# Patient Record
Sex: Male | Born: 1950 | Race: White | Hispanic: No | Marital: Married | Smoking: Current every day smoker
Health system: Southern US, Community
[De-identification: ages and names within clinical notes are randomized; demographics above are authoritative.]

## PROBLEM LIST (undated history)

## (undated) DIAGNOSIS — J438 Other emphysema: Secondary | ICD-10-CM

## (undated) DIAGNOSIS — I10 Essential (primary) hypertension: Secondary | ICD-10-CM

## (undated) DIAGNOSIS — K759 Inflammatory liver disease, unspecified: Secondary | ICD-10-CM

## (undated) DIAGNOSIS — F419 Anxiety disorder, unspecified: Secondary | ICD-10-CM

## (undated) DIAGNOSIS — M549 Dorsalgia, unspecified: Secondary | ICD-10-CM

## (undated) DIAGNOSIS — R600 Localized edema: Secondary | ICD-10-CM

## (undated) DIAGNOSIS — M1990 Unspecified osteoarthritis, unspecified site: Secondary | ICD-10-CM

## (undated) DIAGNOSIS — J449 Chronic obstructive pulmonary disease, unspecified: Secondary | ICD-10-CM

## (undated) DIAGNOSIS — F32A Depression, unspecified: Secondary | ICD-10-CM

## (undated) DIAGNOSIS — G8929 Other chronic pain: Secondary | ICD-10-CM

## (undated) DIAGNOSIS — IMO0001 Reserved for inherently not codable concepts without codable children: Secondary | ICD-10-CM

## (undated) DIAGNOSIS — F329 Major depressive disorder, single episode, unspecified: Secondary | ICD-10-CM

## (undated) DIAGNOSIS — K219 Gastro-esophageal reflux disease without esophagitis: Secondary | ICD-10-CM

## (undated) HISTORY — DX: Dorsalgia, unspecified: M54.9

## (undated) HISTORY — DX: Localized edema: R60.0

## (undated) HISTORY — DX: Essential (primary) hypertension: I10

## (undated) HISTORY — DX: Other emphysema: J43.8

## (undated) HISTORY — PX: OTHER SURGICAL HISTORY: SHX169

## (undated) HISTORY — PX: BACK SURGERY: SHX140

## (undated) HISTORY — DX: Other chronic pain: G89.29

---

## 2002-02-06 ENCOUNTER — Ambulatory Visit (HOSPITAL_COMMUNITY): Admission: RE | Admit: 2002-02-06 | Discharge: 2002-02-06 | Payer: Self-pay | Admitting: Internal Medicine

## 2002-03-07 ENCOUNTER — Encounter (INDEPENDENT_AMBULATORY_CARE_PROVIDER_SITE_OTHER): Payer: Self-pay | Admitting: Internal Medicine

## 2002-03-07 ENCOUNTER — Ambulatory Visit (HOSPITAL_COMMUNITY): Admission: RE | Admit: 2002-03-07 | Discharge: 2002-03-07 | Payer: Self-pay | Admitting: Internal Medicine

## 2002-12-25 ENCOUNTER — Encounter: Payer: Self-pay | Admitting: Neurosurgery

## 2002-12-25 ENCOUNTER — Encounter: Admission: RE | Admit: 2002-12-25 | Discharge: 2002-12-25 | Payer: Self-pay | Admitting: Neurosurgery

## 2003-01-08 ENCOUNTER — Encounter: Payer: Self-pay | Admitting: Neurosurgery

## 2003-01-08 ENCOUNTER — Encounter: Admission: RE | Admit: 2003-01-08 | Discharge: 2003-01-08 | Payer: Self-pay | Admitting: Neurosurgery

## 2004-12-26 ENCOUNTER — Ambulatory Visit: Payer: Self-pay | Admitting: Cardiology

## 2004-12-29 ENCOUNTER — Ambulatory Visit: Payer: Self-pay | Admitting: Cardiology

## 2005-10-05 ENCOUNTER — Ambulatory Visit (HOSPITAL_COMMUNITY): Admission: RE | Admit: 2005-10-05 | Discharge: 2005-10-05 | Payer: Self-pay | Admitting: Neurosurgery

## 2005-10-10 ENCOUNTER — Ambulatory Visit (HOSPITAL_COMMUNITY): Admission: RE | Admit: 2005-10-10 | Discharge: 2005-10-11 | Payer: Self-pay | Admitting: Neurosurgery

## 2005-11-09 ENCOUNTER — Encounter (HOSPITAL_COMMUNITY): Admission: RE | Admit: 2005-11-09 | Discharge: 2005-11-22 | Payer: Self-pay | Admitting: Neurosurgery

## 2005-11-29 ENCOUNTER — Encounter (HOSPITAL_COMMUNITY): Admission: RE | Admit: 2005-11-29 | Discharge: 2005-12-29 | Payer: Self-pay | Admitting: Neurosurgery

## 2012-08-23 DIAGNOSIS — R0602 Shortness of breath: Secondary | ICD-10-CM

## 2015-12-06 DIAGNOSIS — Z79891 Long term (current) use of opiate analgesic: Secondary | ICD-10-CM | POA: Diagnosis not present

## 2015-12-06 DIAGNOSIS — M5030 Other cervical disc degeneration, unspecified cervical region: Secondary | ICD-10-CM | POA: Diagnosis not present

## 2015-12-06 DIAGNOSIS — M47816 Spondylosis without myelopathy or radiculopathy, lumbar region: Secondary | ICD-10-CM | POA: Diagnosis not present

## 2015-12-06 DIAGNOSIS — M5136 Other intervertebral disc degeneration, lumbar region: Secondary | ICD-10-CM | POA: Diagnosis not present

## 2016-01-10 DIAGNOSIS — J0100 Acute maxillary sinusitis, unspecified: Secondary | ICD-10-CM | POA: Diagnosis not present

## 2016-01-10 DIAGNOSIS — J209 Acute bronchitis, unspecified: Secondary | ICD-10-CM | POA: Diagnosis not present

## 2016-01-26 DIAGNOSIS — Z720 Tobacco use: Secondary | ICD-10-CM | POA: Diagnosis not present

## 2016-01-26 DIAGNOSIS — I10 Essential (primary) hypertension: Secondary | ICD-10-CM | POA: Diagnosis not present

## 2016-01-26 DIAGNOSIS — M5431 Sciatica, right side: Secondary | ICD-10-CM | POA: Diagnosis not present

## 2016-01-26 DIAGNOSIS — J438 Other emphysema: Secondary | ICD-10-CM | POA: Diagnosis not present

## 2016-01-26 DIAGNOSIS — Z1389 Encounter for screening for other disorder: Secondary | ICD-10-CM | POA: Diagnosis not present

## 2016-01-26 DIAGNOSIS — M545 Low back pain: Secondary | ICD-10-CM | POA: Diagnosis not present

## 2016-03-03 DIAGNOSIS — F1123 Opioid dependence with withdrawal: Secondary | ICD-10-CM | POA: Diagnosis not present

## 2016-03-03 DIAGNOSIS — R7301 Impaired fasting glucose: Secondary | ICD-10-CM | POA: Diagnosis not present

## 2016-03-03 DIAGNOSIS — Z720 Tobacco use: Secondary | ICD-10-CM | POA: Diagnosis not present

## 2016-03-03 DIAGNOSIS — K210 Gastro-esophageal reflux disease with esophagitis: Secondary | ICD-10-CM | POA: Diagnosis not present

## 2016-03-03 DIAGNOSIS — F330 Major depressive disorder, recurrent, mild: Secondary | ICD-10-CM | POA: Diagnosis not present

## 2016-03-03 DIAGNOSIS — I10 Essential (primary) hypertension: Secondary | ICD-10-CM | POA: Diagnosis not present

## 2016-03-08 DIAGNOSIS — I10 Essential (primary) hypertension: Secondary | ICD-10-CM | POA: Diagnosis not present

## 2016-03-08 DIAGNOSIS — M5431 Sciatica, right side: Secondary | ICD-10-CM | POA: Diagnosis not present

## 2016-03-08 DIAGNOSIS — M545 Low back pain: Secondary | ICD-10-CM | POA: Diagnosis not present

## 2016-03-08 DIAGNOSIS — Z720 Tobacco use: Secondary | ICD-10-CM | POA: Diagnosis not present

## 2016-03-08 DIAGNOSIS — J438 Other emphysema: Secondary | ICD-10-CM | POA: Diagnosis not present

## 2016-04-03 DIAGNOSIS — R1310 Dysphagia, unspecified: Secondary | ICD-10-CM | POA: Diagnosis not present

## 2016-04-03 DIAGNOSIS — I10 Essential (primary) hypertension: Secondary | ICD-10-CM | POA: Diagnosis not present

## 2016-04-06 ENCOUNTER — Encounter (INDEPENDENT_AMBULATORY_CARE_PROVIDER_SITE_OTHER): Payer: Self-pay | Admitting: *Deleted

## 2016-04-11 ENCOUNTER — Encounter (INDEPENDENT_AMBULATORY_CARE_PROVIDER_SITE_OTHER): Payer: Self-pay | Admitting: Internal Medicine

## 2016-04-11 ENCOUNTER — Encounter (INDEPENDENT_AMBULATORY_CARE_PROVIDER_SITE_OTHER): Payer: Self-pay

## 2016-04-11 ENCOUNTER — Encounter (INDEPENDENT_AMBULATORY_CARE_PROVIDER_SITE_OTHER): Payer: Self-pay | Admitting: *Deleted

## 2016-04-11 ENCOUNTER — Ambulatory Visit (INDEPENDENT_AMBULATORY_CARE_PROVIDER_SITE_OTHER): Payer: Medicare Other | Admitting: Internal Medicine

## 2016-04-11 DIAGNOSIS — R1319 Other dysphagia: Secondary | ICD-10-CM | POA: Insufficient documentation

## 2016-04-11 DIAGNOSIS — R1310 Dysphagia, unspecified: Secondary | ICD-10-CM | POA: Diagnosis not present

## 2016-04-11 DIAGNOSIS — F1920 Other psychoactive substance dependence, uncomplicated: Secondary | ICD-10-CM | POA: Insufficient documentation

## 2016-04-11 DIAGNOSIS — I10 Essential (primary) hypertension: Secondary | ICD-10-CM | POA: Insufficient documentation

## 2016-04-11 NOTE — Patient Instructions (Signed)
DG esophagram.   

## 2016-04-11 NOTE — Progress Notes (Signed)
   Subjective:    Patient ID: Christian Carrillo, male    DOB: [birth year], 65 y.o.   MRN: [id number]  HPI Referred by Dr. Quintin Alto for dysphagia/EGD/ED. He has a cough at night. He is on a soft diet. He says he chews his foods well. Hx of food impaction and had to have emergency EGD/ED x 3 year at [facility]. If he swallows anything large, he is afraid it will lodge. Appetite is good for the most part. No weight loss. Hx of ETOH abuse in the past.  Hx of chronic back pain. Has been off Oxycodone x 7 weeks.   06/09/2011 EGD with biopsy for H. Pylori: [place]: Bile reflux in the body and the antrum of the stomach.  06/16/2011 Colonoscopy: Dr. Yaakov Guthrie, weight loss: Scattered diverticula in sigmoid.    Review of Systems Past Medical History  Diagnosis Date  . Hypertension   . Chronic back pain     Past Surgical History  Procedure Laterality Date  . Back surgery    . Rt shoulder surgery pinning.     . Rt femur fx      traction    Allergies  Allergen Reactions  . Codeine     itching    No current outpatient prescriptions on file prior to visit.   No current facility-administered medications on file prior to visit.   Current Outpatient Prescriptions  Medication Sig Dispense Refill  . amitriptyline (ELAVIL) 25 MG tablet Take 25 mg by mouth at bedtime.    Marland Kitchen amLODipine (NORVASC) 10 MG tablet Take 10 mg by mouth daily.    . baclofen (LIORESAL) 10 MG tablet Take 10 mg by mouth 3 (three) times daily.    Marland Kitchen FLUoxetine (PROZAC) 20 MG tablet Take 20 mg by mouth daily.    Marland Kitchen lisinopril (PRINIVIL,ZESTRIL) 20 MG tablet Take 20 mg by mouth daily.    . naproxen (NAPROSYN) 500 MG tablet Take 500 mg by mouth 2 (two) times daily with a meal.    . pantoprazole (PROTONIX) 40 MG tablet Take 40 mg by mouth daily.     No current facility-administered medications for this visit.        Objective:   Physical ExamBlood pressure 132/62, pulse 64, temperature 98.4 F (36.9 C), height 6\' 1"  (1.854 m), weight  189 lb 12.8 oz (86.093 kg). Alert and oriented. Skin warm and dry. Oral mucosa is moist.   . Sclera anicteric, conjunctivae is pink. Thyroid not enlarged. No cervical lymphadenopathy. Lungs clear. Heart regular rate and rhythm.  Abdomen is soft. Bowel sounds are positive. No hepatomegaly. No abdominal masses felt. No tenderness.  No edema to lower extremities.          Assessment & Plan:  Dysphagia. Am going to get a DG Esophagram. Further recommendations to follow.

## 2016-04-13 ENCOUNTER — Telehealth (INDEPENDENT_AMBULATORY_CARE_PROVIDER_SITE_OTHER): Payer: Self-pay | Admitting: *Deleted

## 2016-04-13 ENCOUNTER — Ambulatory Visit (HOSPITAL_COMMUNITY)
Admission: RE | Admit: 2016-04-13 | Discharge: 2016-04-13 | Disposition: A | Discharge status: Home / Self Care | Payer: Medicare Other | Source: Ambulatory Visit | Attending: Internal Medicine | Admitting: Internal Medicine

## 2016-04-13 DIAGNOSIS — K219 Gastro-esophageal reflux disease without esophagitis: Secondary | ICD-10-CM | POA: Diagnosis not present

## 2016-04-13 DIAGNOSIS — R1310 Dysphagia, unspecified: Secondary | ICD-10-CM | POA: Insufficient documentation

## 2016-04-13 NOTE — Telephone Encounter (Signed)
Patient wanted you to know he is sneezing a whole lot and real hard -- they read on Internet this could be from esophagus

## 2016-04-13 NOTE — Telephone Encounter (Signed)
noted 

## 2016-04-14 ENCOUNTER — Inpatient Hospital Stay (HOSPITAL_COMMUNITY): Admission: RE | Admit: 2016-04-14 | Payer: Medicare Other | Source: Ambulatory Visit

## 2016-04-21 ENCOUNTER — Other Ambulatory Visit (INDEPENDENT_AMBULATORY_CARE_PROVIDER_SITE_OTHER): Payer: Self-pay | Admitting: Internal Medicine

## 2016-04-21 ENCOUNTER — Telehealth (INDEPENDENT_AMBULATORY_CARE_PROVIDER_SITE_OTHER): Payer: Self-pay | Admitting: Internal Medicine

## 2016-04-21 DIAGNOSIS — R059 Cough, unspecified: Secondary | ICD-10-CM

## 2016-04-21 DIAGNOSIS — R1310 Dysphagia, unspecified: Secondary | ICD-10-CM

## 2016-04-21 DIAGNOSIS — R05 Cough: Secondary | ICD-10-CM

## 2016-04-21 NOTE — Telephone Encounter (Signed)
Ann, EGD 

## 2016-04-25 DIAGNOSIS — R05 Cough: Secondary | ICD-10-CM | POA: Diagnosis not present

## 2016-04-25 DIAGNOSIS — I10 Essential (primary) hypertension: Secondary | ICD-10-CM | POA: Diagnosis not present

## 2016-04-25 DIAGNOSIS — J9801 Acute bronchospasm: Secondary | ICD-10-CM | POA: Diagnosis not present

## 2016-04-25 DIAGNOSIS — J309 Allergic rhinitis, unspecified: Secondary | ICD-10-CM | POA: Diagnosis not present

## 2016-04-25 NOTE — Telephone Encounter (Signed)
EGD sch'd 05/05/16, patient' wife was given detailed instructions

## 2016-05-02 ENCOUNTER — Ambulatory Visit (INDEPENDENT_AMBULATORY_CARE_PROVIDER_SITE_OTHER): Payer: Self-pay | Admitting: Internal Medicine

## 2016-05-03 ENCOUNTER — Encounter (HOSPITAL_COMMUNITY)
Admission: RE | Disposition: A | Discharge status: Home / Self Care | Payer: Self-pay | Source: Ambulatory Visit | Attending: Internal Medicine

## 2016-05-03 ENCOUNTER — Encounter (HOSPITAL_COMMUNITY): Payer: Self-pay | Admitting: *Deleted

## 2016-05-03 ENCOUNTER — Ambulatory Visit (HOSPITAL_COMMUNITY)
Admission: RE | Admit: 2016-05-03 | Discharge: 2016-05-03 | Disposition: A | Discharge status: Home / Self Care | Payer: Medicare Other | Source: Ambulatory Visit | Attending: Internal Medicine | Admitting: Internal Medicine

## 2016-05-03 DIAGNOSIS — K449 Diaphragmatic hernia without obstruction or gangrene: Secondary | ICD-10-CM | POA: Diagnosis not present

## 2016-05-03 DIAGNOSIS — K219 Gastro-esophageal reflux disease without esophagitis: Secondary | ICD-10-CM | POA: Diagnosis not present

## 2016-05-03 DIAGNOSIS — I10 Essential (primary) hypertension: Secondary | ICD-10-CM | POA: Diagnosis not present

## 2016-05-03 DIAGNOSIS — F329 Major depressive disorder, single episode, unspecified: Secondary | ICD-10-CM | POA: Insufficient documentation

## 2016-05-03 DIAGNOSIS — Z79899 Other long term (current) drug therapy: Secondary | ICD-10-CM | POA: Insufficient documentation

## 2016-05-03 DIAGNOSIS — R059 Cough, unspecified: Secondary | ICD-10-CM

## 2016-05-03 DIAGNOSIS — R1319 Other dysphagia: Secondary | ICD-10-CM | POA: Insufficient documentation

## 2016-05-03 DIAGNOSIS — Z7952 Long term (current) use of systemic steroids: Secondary | ICD-10-CM | POA: Diagnosis not present

## 2016-05-03 DIAGNOSIS — G8929 Other chronic pain: Secondary | ICD-10-CM | POA: Diagnosis not present

## 2016-05-03 DIAGNOSIS — K222 Esophageal obstruction: Secondary | ICD-10-CM | POA: Insufficient documentation

## 2016-05-03 DIAGNOSIS — J449 Chronic obstructive pulmonary disease, unspecified: Secondary | ICD-10-CM | POA: Diagnosis not present

## 2016-05-03 DIAGNOSIS — F419 Anxiety disorder, unspecified: Secondary | ICD-10-CM | POA: Diagnosis not present

## 2016-05-03 DIAGNOSIS — M1990 Unspecified osteoarthritis, unspecified site: Secondary | ICD-10-CM | POA: Insufficient documentation

## 2016-05-03 DIAGNOSIS — Z791 Long term (current) use of non-steroidal anti-inflammatories (NSAID): Secondary | ICD-10-CM | POA: Diagnosis not present

## 2016-05-03 DIAGNOSIS — R05 Cough: Secondary | ICD-10-CM

## 2016-05-03 DIAGNOSIS — Z87891 Personal history of nicotine dependence: Secondary | ICD-10-CM | POA: Insufficient documentation

## 2016-05-03 DIAGNOSIS — R1310 Dysphagia, unspecified: Secondary | ICD-10-CM

## 2016-05-03 HISTORY — DX: Chronic obstructive pulmonary disease, unspecified: J44.9

## 2016-05-03 HISTORY — DX: Inflammatory liver disease, unspecified: K75.9

## 2016-05-03 HISTORY — DX: Anxiety disorder, unspecified: F41.9

## 2016-05-03 HISTORY — DX: Major depressive disorder, single episode, unspecified: F32.9

## 2016-05-03 HISTORY — DX: Gastro-esophageal reflux disease without esophagitis: K21.9

## 2016-05-03 HISTORY — DX: Reserved for inherently not codable concepts without codable children: IMO0001

## 2016-05-03 HISTORY — PX: ESOPHAGOGASTRODUODENOSCOPY: SHX5428

## 2016-05-03 HISTORY — DX: Depression, unspecified: F32.A

## 2016-05-03 HISTORY — DX: Unspecified osteoarthritis, unspecified site: M19.90

## 2016-05-03 SURGERY — EGD (ESOPHAGOGASTRODUODENOSCOPY)
Anesthesia: Moderate Sedation

## 2016-05-03 MED ORDER — STERILE WATER FOR IRRIGATION IR SOLN
Status: DC | PRN
  Administered 2016-05-03: 2.5 mL

## 2016-05-03 MED ORDER — MEPERIDINE HCL 50 MG/ML IJ SOLN
INTRAMUSCULAR | Status: AC
  Filled 2016-05-03: qty 1

## 2016-05-03 MED ORDER — PROMETHAZINE HCL 25 MG/ML IJ SOLN
INTRAMUSCULAR | Status: AC
  Filled 2016-05-03: qty 1

## 2016-05-03 MED ORDER — MIDAZOLAM HCL 5 MG/5ML IJ SOLN
INTRAMUSCULAR | Status: DC | PRN
  Administered 2016-05-03: 2 mg via INTRAVENOUS
  Administered 2016-05-03: 3 mg via INTRAVENOUS

## 2016-05-03 MED ORDER — MEPERIDINE HCL 50 MG/ML IJ SOLN
INTRAMUSCULAR | Status: DC | PRN
  Administered 2016-05-03 (×2): 25 mg via INTRAVENOUS

## 2016-05-03 MED ORDER — MIDAZOLAM HCL 5 MG/5ML IJ SOLN
INTRAMUSCULAR | Status: AC
  Filled 2016-05-03: qty 10

## 2016-05-03 MED ORDER — BUTAMBEN-TETRACAINE-BENZOCAINE 2-2-14 % EX AERO
INHALATION_SPRAY | CUTANEOUS | Status: DC | PRN
  Administered 2016-05-03: 2 via TOPICAL

## 2016-05-03 MED ORDER — PROMETHAZINE HCL 25 MG/ML IJ SOLN
INTRAMUSCULAR | Status: DC | PRN
  Administered 2016-05-03: 25 mg via INTRAVENOUS

## 2016-05-03 MED ORDER — SODIUM CHLORIDE 0.9 % IV SOLN
INTRAVENOUS | Status: DC
  Administered 2016-05-03: 1000 mL via INTRAVENOUS

## 2016-05-03 MED ORDER — SODIUM CHLORIDE 0.9% FLUSH
INTRAVENOUS | Status: AC
  Filled 2016-05-03: qty 10

## 2016-05-03 NOTE — H&P (Signed)
Christian Carrillo is an 65 y.o. male.   Chief Complaint: Patient's here for EGD and ED. HPI: Patient is 65 year old Caucasian male with multiple medical problems including chronic GERD who presents with 6 week history of dysphagia to solids. He is having daily difficulty. He points to suprasternal area as the site of bolus obstruction. Recent barium study did not reveal stricture suggested esophageal motility disorder. Status heartburn is well controlled with repeat. He denies weight loss or melena. He is on naproxen for back pain. He does not drink alcohol and quit cigarette smoking 8 weeks ago.  Past Medical History  Diagnosis Date  . Hypertension   . Chronic back pain   . Depression   . Anxiety   . Arthritis   . GERD (gastroesophageal reflux disease)   . Hepatitis   . COPD (chronic obstructive pulmonary disease) ([place])   . Shortness of breath dyspnea     Past Surgical History  Procedure Laterality Date  . Back surgery    . Rt shoulder surgery pinning.     . Rt femur fx      traction    History reviewed. No pertinent family history. Social History:  reports that he quit smoking about 8 weeks ago. His smoking use included Cigarettes. He has a 30 pack-year smoking history. He does not have any smokeless tobacco history on file. He reports that he does not drink alcohol or use illicit drugs.  Allergies:  Allergies  Allergen Reactions  . Codeine     itching    Medications Prior to Admission  Medication Sig Dispense Refill  . amitriptyline (ELAVIL) 25 MG tablet Take 25 mg by mouth at bedtime.    Marland Kitchen amLODipine (NORVASC) 10 MG tablet Take 10 mg by mouth daily as needed (FOR EXTREMELY HIGH BP.).     Marland Kitchen baclofen (LIORESAL) 10 MG tablet Take 10 mg by mouth 3 (three) times daily.    Marland Kitchen FLUoxetine (PROZAC) 20 MG capsule Take 1 capsule by mouth daily.  0  . lisinopril (PRINIVIL,ZESTRIL) 20 MG tablet Take 40 mg by mouth daily.     . naproxen (NAPROSYN) 500 MG tablet Take 500 mg by mouth 2  (two) times daily with a meal.    . pantoprazole (PROTONIX) 40 MG tablet Take 40 mg by mouth daily.    . predniSONE (DELTASONE) 20 MG tablet Take 20 mg by mouth daily with breakfast.      No results found for this or any previous visit (from the past 48 hour(s)). No results found.  ROS  Blood pressure 179/93, pulse 59, temperature 97.8 F (36.6 C), temperature source Oral, resp. rate 14, height 6\' 1"  (1.854 m), weight 189 lb (85.73 kg), SpO2 97 %. Physical Exam  Constitutional: He appears well-developed and well-nourished.  HENT:  Mouth/Throat: Oropharynx is clear and moist.  Eyes: Conjunctivae are normal. No scleral icterus.  Patient has prosthetic left eye and ptosis.  Neck: No thyromegaly present.  Cardiovascular: Normal rate, regular rhythm and normal heart sounds.   No murmur heard. Respiratory: Effort normal and breath sounds normal.  GI: Soft. He exhibits no distension and no mass. There is no tenderness.  Musculoskeletal: He exhibits no edema.  Lymphadenopathy:    He has no cervical adenopathy.  Neurological: He is alert.  Skin: Skin is warm and dry.     Assessment/Plan Solid food dysphagia in patient with chronic GERD. EGD and ED.  Hildred Laser, MD 05/03/2016, 7:39 AM

## 2016-05-03 NOTE — Discharge Instructions (Signed)
Resume usual medications and diet. Remember to chew food thoroughly before swallowing No driving for 24 hours. Please call office with progress report in one week. Dr. Laural Golden [phone]  Esophagogastroduodenoscopy, Care After Refer to this sheet in the next few weeks. These instructions provide you with information about caring for yourself after your procedure. Your health care provider may also give you more specific instructions. Your treatment has been planned according to current medical practices, but problems sometimes occur. Call your health care provider if you have any problems or questions after your procedure. WHAT TO EXPECT AFTER THE PROCEDURE After your procedure, it is typical to feel:  Soreness in your throat.  Pain with swallowing.  Sick to your stomach (nauseous).  Bloated.  Dizzy.  Fatigued. HOME CARE INSTRUCTIONS  Do not eat or drink anything until the numbing medicine (local anesthetic) has worn off and your gag reflex has returned. You will know that the local anesthetic has worn off when you can swallow comfortably.  Do not drive or operate machinery until directed by your health care provider.  Take medicines only as directed by your health care provider. SEEK MEDICAL CARE IF:   You cannot stop coughing.  You are not urinating at all or less than usual. SEEK IMMEDIATE MEDICAL CARE IF:  You have difficulty swallowing.  You cannot eat or drink.  You have worsening throat or chest pain.  You have dizziness or lightheadedness or you faint.  You have nausea or vomiting.  You have chills.  You have a fever.  You have severe abdominal pain.  You have black, tarry, or bloody stools.   This information is not intended to replace advice given to you by your health care provider. Make sure you discuss any questions you have with your health care provider.   Document Released: 10/30/2012 Document Revised: 12/04/2014 Document Reviewed:  10/30/2012 Elsevier Interactive Patient Education 2016 Elsevier Inc.  Hiatal Hernia A hiatal hernia occurs when part of your stomach slides above the muscle that separates your abdomen from your chest (diaphragm). You can be born with a hiatal hernia (congenital), or it may develop over time. In almost all cases of hiatal hernia, only the top part of the stomach pushes through.  Many people have a hiatal hernia with no symptoms. The larger the hernia, the more likely that you will have symptoms. In some cases, a hiatal hernia allows stomach acid to flow back into the tube that carries food from your mouth to your stomach (esophagus). This may cause heartburn symptoms. Severe heartburn symptoms may mean you have developed a condition called gastroesophageal reflux disease (GERD).  CAUSES  Hiatal hernias are caused by a weakness in the opening (hiatus) where your esophagus passes through your diaphragm to attach to the upper part of your stomach. You may be born with a weakness in your hiatus, or a weakness can develop. RISK FACTORS Older age is a major risk factor for a hiatal hernia. Anything that increases pressure on your diaphragm can also increase your risk of a hiatal hernia. This includes:  Pregnancy.  Excess weight.  Frequent constipation. SIGNS AND SYMPTOMS  People with a hiatal hernia often have no symptoms. If symptoms develop, they are almost always caused by GERD. They may include:  Heartburn.  Belching.  Indigestion.  Trouble swallowing.  Coughing or wheezing.  Sore throat.  Hoarseness.  Chest pain. DIAGNOSIS  A hiatal hernia is sometimes found during an exam for another problem. Your health care provider may  suspect a hiatal hernia if you have symptoms of GERD. Tests may be done to diagnose GERD. These may include:  X-rays of your stomach or chest.  An upper gastrointestinal (GI) series. This is an X-ray exam of your GI tract involving the use of a chalky liquid  that you swallow. The liquid shows up clearly on the X-ray.  Endoscopy. This is a procedure to look into your stomach using a thin, flexible tube that has a tiny camera and light on the end of it. TREATMENT  If you have no symptoms, you may not need treatment. If you have symptoms, treatment may include:  Dietary and lifestyle changes to help reduce GERD symptoms.  Medicines. These may include:  Over-the-counter antacids.  Medicines that make your stomach empty more quickly.  Medicines that block the production of stomach acid (H2 blockers).  Stronger medicines to reduce stomach acid (proton pump inhibitors).  You may need surgery to repair the hernia if other treatments are not helping. HOME CARE INSTRUCTIONS   Take all medicines as directed by your health care provider.  Quit smoking, if you smoke.  Try to achieve and maintain a healthy body weight.  Eat frequent small meals instead of three large meals a day. This keeps your stomach from getting too full.  Eat slowly.  Do not lie down right after eating.  Do noteat 1-2 hours before bed.   Do not drink beverages with caffeine. These include cola, coffee, cocoa, and tea.  Do not drink alcohol.  Avoid foods that can make symptoms of GERD worse. These may include:  Fatty foods.  Citrus fruits.  Other foods and drinks that contain acid.  Avoid putting pressure on your belly. Anything that puts pressure on your belly increases the amount of acid that may be pushed up into your esophagus.   Avoid bending over, especially after eating.  Raise the head of your bed by putting blocks under the legs. This keeps your head and esophagus higher than your stomach.  Do not wear tight clothing around your chest or stomach.  Try not to strain when having a bowel movement, when urinating, or when lifting heavy objects. SEEK MEDICAL CARE IF:  Your symptoms are not controlled with medicines or lifestyle changes.  You are  having trouble swallowing.  You have coughing or wheezing that will not go away. SEEK IMMEDIATE MEDICAL CARE IF:  Your pain is getting worse.  Your pain spreads to your arms, neck, jaw, teeth, or back.  You have shortness of breath.  You sweat for no reason.  You feel sick to your stomach (nauseous) or vomit.  You vomit blood.  You have bright red blood in your stools.  You have black, tarry stools.    This information is not intended to replace advice given to you by your health care provider. Make sure you discuss any questions you have with your health care provider.   Document Released: 02/03/2004 Document Revised: 12/04/2014 Document Reviewed: 10/31/2013 Elsevier Interactive Patient Education [employer].

## 2016-05-03 NOTE — Op Note (Signed)
[facility] Patient Name: Christian Carrillo Procedure Date: 05/03/2016 7:29 AM MRN: [id number] Date of Birth: March 13, 1951 Attending MD: Hildred Laser , MD CSN: [id number] Age: 65 Admit Type: Outpatient Procedure:                Upper GI endoscopy Indications:              Esophageal dysphagia, Gastro-esophageal reflux                            disease Providers:                Hildred Laser, MD, Lurline Del, RN, Isabella Stalling,                            Technician Referring MD:             Manon Hilding Medicines:                Cetacaine spray, Promethazine 25 mg IV, Meperidine                            50 mg IV, Midazolam 5 mg IV Complications:            No immediate complications. Estimated Blood Loss:     Estimated blood loss was minimal. Procedure:                Pre-Anesthesia Assessment:                           - Prior to the procedure, a History and Physical                            was performed, and patient medications and                            allergies were reviewed. The patient's tolerance of                            previous anesthesia was also reviewed. The risks                            and benefits of the procedure and the sedation                            options and risks were discussed with the patient.                            All questions were answered, and informed consent                            was obtained. Prior Anticoagulants: The patient                            last took ibuprofen 1 day prior to the procedure.  ASA Grade Assessment: III - A patient with severe                            systemic disease. After reviewing the risks and                            benefits, the patient was deemed in satisfactory                            condition to undergo the procedure.                           After obtaining informed consent, the endoscope was                            passed under direct vision.  Throughout the                            procedure, the patient's blood pressure, pulse, and                            oxygen saturations were monitored continuously. The                            [id number]) was introduced through the mouth,                            and advanced to the second part of duodenum. The                            upper GI endoscopy was accomplished without                            difficulty. The patient tolerated the procedure                            well. Scope In: 7:51:28 AM Scope Out: 7:58:06 AM Total Procedure Duration: 0 hours 6 minutes 38 seconds  Findings:      The examined esophagus was normal.      A mild Schatzki ring (acquired) was found at the gastroesophageal       junction. The scope was withdrawn. Dilation was performed with a Maloney       dilator with mild resistance at 56 Fr. The dilation site was examined       following endoscope reinsertion and showed inimal blring secondary to       disruption of ring.      A 3 cm hiatal hernia was present.      The entire examined stomach was normal.      The duodenal bulb and second portion of the duodenum were normal. Impression:               - Normal esophagus.                           - Mild Schatzki ring. Dilated.                           -  3 cm hiatal hernia.                           - Normal stomach.                           - Normal duodenal bulb and second portion of the                            duodenum.                           - No specimens collected. Moderate Sedation:      Moderate (conscious) sedation was administered by the endoscopy nurse       and supervised by the endoscopist. The following parameters were       monitored: oxygen saturation, heart rate, blood pressure, CO2       capnography and response to care. Total physician intraservice time was       12 minutes. Recommendation:           - Patient has a contact number available for                             emergencies. The signs and symptoms of potential                            delayed complications were discussed with the                            patient. Return to normal activities tomorrow.                            Written discharge instructions were provided to the                            patient.                           - Resume previous diet today.                           - Continue present medications.                           - Return to my office in 1 year. Procedure Code(s):        --- Professional ---                           [phone], Esophagogastroduodenoscopy, flexible,                            transoral; diagnostic, including collection of                            specimen(s) by brushing or washing, when performed                            (  separate procedure)                           43450, Dilation of esophagus, by unguided sound or                            bougie, single or multiple passes                           99152, Moderate sedation services provided by the                            same physician or other qualified health care                            professional performing the diagnostic or                            therapeutic service that the sedation supports,                            requiring the presence of an independent trained                            observer to assist in the monitoring of the                            patient's level of consciousness and physiological                            status; initial 15 minutes of intraservice time,                            patient age 67 years or older Diagnosis Code(s):        --- Professional ---                           K22.2, Esophageal obstruction                           K44.9, Diaphragmatic hernia without obstruction or                            gangrene                           R13.14, Dysphagia, pharyngoesophageal phase                           K21.9,  Gastro-esophageal reflux disease without                            esophagitis CPT copyright 2016 American Medical Association. All rights reserved. The codes documented in this report are preliminary and upon coder review may  be revised to meet current compliance requirements. Hildred Laser, MD Hildred Laser, MD 05/03/2016 8:14:12 AM This  report has been signed electronically. Number of Addenda: 0

## 2016-05-05 ENCOUNTER — Encounter (HOSPITAL_COMMUNITY): Payer: Self-pay | Admitting: Internal Medicine

## 2016-05-09 ENCOUNTER — Telehealth (INDEPENDENT_AMBULATORY_CARE_PROVIDER_SITE_OTHER): Payer: Self-pay | Admitting: Internal Medicine

## 2016-05-09 NOTE — Telephone Encounter (Signed)
Patient called, stated that he was supposed to report back after a week on how he was doing since his procedure.  He is still the same problem as before the procedure.  He's also checking results.  [phone]

## 2016-05-10 NOTE — Telephone Encounter (Signed)
Referral and notes faxed to [facility], they will contact patient directly with appt 

## 2016-05-10 NOTE — Telephone Encounter (Signed)
Dr.Rehman was given this message. He states that the patient had no Bx done at the time of procedure. If the patient is still having problems - I want him to have the Manometry Impedance Study @ [facility].

## 2016-05-10 NOTE — Telephone Encounter (Signed)
Dr.Rehman will be made aware. 

## 2016-05-10 NOTE — Telephone Encounter (Signed)
Patient was called and a message was left with informtion about the test.

## 2016-05-22 DIAGNOSIS — K219 Gastro-esophageal reflux disease without esophagitis: Secondary | ICD-10-CM | POA: Diagnosis not present

## 2016-05-22 DIAGNOSIS — R1310 Dysphagia, unspecified: Secondary | ICD-10-CM | POA: Diagnosis not present

## 2016-06-01 ENCOUNTER — Telehealth (INDEPENDENT_AMBULATORY_CARE_PROVIDER_SITE_OTHER): Payer: Self-pay | Admitting: Internal Medicine

## 2016-06-01 NOTE — Telephone Encounter (Signed)
Patient called and stated that he would like his results and he'd like to know what the next step is.  [phone]

## 2016-06-02 ENCOUNTER — Telehealth (INDEPENDENT_AMBULATORY_CARE_PROVIDER_SITE_OTHER): Payer: Self-pay | Admitting: Internal Medicine

## 2016-06-02 ENCOUNTER — Other Ambulatory Visit (INDEPENDENT_AMBULATORY_CARE_PROVIDER_SITE_OTHER): Payer: Self-pay | Admitting: Internal Medicine

## 2016-06-02 DIAGNOSIS — M545 Low back pain: Secondary | ICD-10-CM | POA: Diagnosis not present

## 2016-06-02 DIAGNOSIS — G4733 Obstructive sleep apnea (adult) (pediatric): Secondary | ICD-10-CM | POA: Diagnosis not present

## 2016-06-02 DIAGNOSIS — R05 Cough: Secondary | ICD-10-CM | POA: Diagnosis not present

## 2016-06-02 DIAGNOSIS — I10 Essential (primary) hypertension: Secondary | ICD-10-CM | POA: Diagnosis not present

## 2016-06-02 MED ORDER — DILTIAZEM HCL 60 MG PO TABS: 60.0000 mg | ORAL_TABLET | Freq: Three times a day (TID) | ORAL | Status: DC

## 2016-06-02 NOTE — Telephone Encounter (Signed)
Results of manometry discussed with patient and his wife. Start patient on diltiazem 60 mg by mouth 30 minutes before each meal.. Patient's blood pressure has been running high and Dr. Quintin Alto changes medications today. Patient and his wife informed to call if he develops post dural symptoms. Prescription sent to his pharmacy. Patient will need office visit in 2 months.

## 2016-06-02 NOTE — Telephone Encounter (Signed)
Dr.Rehman has been made aware. He states that he is going to review the results and he will call the patient with that result and recommendations.

## 2016-06-03 DIAGNOSIS — Z7689 Persons encountering health services in other specified circumstances: Secondary | ICD-10-CM | POA: Diagnosis not present

## 2016-06-03 DIAGNOSIS — I10 Essential (primary) hypertension: Secondary | ICD-10-CM | POA: Diagnosis not present

## 2016-06-05 ENCOUNTER — Encounter (INDEPENDENT_AMBULATORY_CARE_PROVIDER_SITE_OTHER): Payer: Self-pay

## 2016-06-05 ENCOUNTER — Encounter (INDEPENDENT_AMBULATORY_CARE_PROVIDER_SITE_OTHER): Payer: Self-pay | Admitting: *Deleted

## 2016-06-21 DIAGNOSIS — G4733 Obstructive sleep apnea (adult) (pediatric): Secondary | ICD-10-CM | POA: Diagnosis not present

## 2016-07-11 DIAGNOSIS — G4733 Obstructive sleep apnea (adult) (pediatric): Secondary | ICD-10-CM | POA: Diagnosis not present

## 2016-07-18 ENCOUNTER — Encounter (INDEPENDENT_AMBULATORY_CARE_PROVIDER_SITE_OTHER): Payer: Self-pay | Admitting: *Deleted

## 2016-07-18 ENCOUNTER — Ambulatory Visit (INDEPENDENT_AMBULATORY_CARE_PROVIDER_SITE_OTHER): Payer: Medicare Other | Admitting: Internal Medicine

## 2016-07-18 ENCOUNTER — Encounter (INDEPENDENT_AMBULATORY_CARE_PROVIDER_SITE_OTHER): Payer: Self-pay | Admitting: Internal Medicine

## 2016-07-18 VITALS — BP 158/80 | HR 64 | Temp 98.1°F | Ht 73.0 in | Wt 201.4 lb

## 2016-07-18 DIAGNOSIS — R1310 Dysphagia, unspecified: Secondary | ICD-10-CM | POA: Diagnosis not present

## 2016-07-18 DIAGNOSIS — K219 Gastro-esophageal reflux disease without esophagitis: Secondary | ICD-10-CM

## 2016-07-18 DIAGNOSIS — B1920 Unspecified viral hepatitis C without hepatic coma: Secondary | ICD-10-CM | POA: Diagnosis not present

## 2016-07-18 LAB — CBC WITH DIFFERENTIAL/PLATELET
Basophils Absolute: 0 cells/uL (ref 0–200)
Basophils Relative: 0 %
Eosinophils Absolute: 380 cells/uL (ref 15–500)
Eosinophils Relative: 4 %
HCT: 44.9 % (ref 38.5–50.0)
Hemoglobin: 15.6 g/dL (ref 13.2–17.1)
Lymphocytes Relative: 35 %
Lymphs Abs: 3325 cells/uL (ref 850–3900)
MCH: 32.4 pg (ref 27.0–33.0)
MCHC: 34.7 g/dL (ref 32.0–36.0)
MCV: 93.2 fL (ref 80.0–100.0)
MPV: 10.4 fL (ref 7.5–12.5)
Monocytes Absolute: 950 cells/uL (ref 200–950)
Monocytes Relative: 10 %
Neutro Abs: 4845 cells/uL (ref 1500–7800)
Neutrophils Relative %: 51 %
Platelets: 249 10*3/uL (ref 140–400)
RBC: 4.82 MIL/uL (ref 4.20–5.80)
RDW: 13.5 % (ref 11.0–15.0)
WBC: 9.5 10*3/uL (ref 3.8–10.8)

## 2016-07-18 MED ORDER — PANTOPRAZOLE SODIUM 40 MG PO TBEC: 40.0000 mg | DELAYED_RELEASE_TABLET | Freq: Two times a day (BID) | ORAL | 3 refills | Status: DC

## 2016-07-18 NOTE — Progress Notes (Signed)
Subjective:    Patient ID: Christian Carrillo, male    DOB: 1951-03-13, 65 y.o.   MRN: [id number]  HPI Here today after undergoing an EGD in June of 2017.  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 He says he is doing better. He is gaining weight. He says he still "hacks and gags" but it is better.  He has gained 11 pounds. Appetite is much better. His swallowing is better.  He says his esophagus is much better. He says he has break thru reflux.  Would like his Protonix increased to BID. He says is opoid free since April.  He tells me he has Hepatitis C and would like tx. He has never been treated in the past.   05/03/2016 EGD/ED: dysphagia: GERD:  Impression:               - Normal esophagus.                           - Mild Schatzki ring. Dilated.                           - 3 cm hiatal hernia.                           - Normal stomach.                           - Normal duodenal bulb and second portion of the                            duodenum.                           - No specimens collected.  05/22/2016 Esophageal Manometry: dysphagia. Nutcrackers esophagus.   05/14/2016 DG Esopahgram: IMPRESSION: 1. Esophageal dysmotility, likely presbyesophagus. 2. Otherwise, normal esophagram.  Review of Systems Past Medical History:  Diagnosis Date  . Anxiety   . Arthritis   . Chronic back pain   . COPD (chronic obstructive pulmonary disease) ([place])   . Depression   . GERD (gastroesophageal reflux disease)   . Hepatitis   . Hypertension   . Shortness of breath dyspnea     Past Surgical History:  Procedure Laterality Date  . BACK SURGERY    . ESOPHAGOGASTRODUODENOSCOPY N/A 05/03/2016   Procedure: ESOPHAGOGASTRODUODENOSCOPY (EGD);  Surgeon: Rogene Houston, MD;  [facility];  Service: Endoscopy;  Laterality: N/A;  1:00 - moved to 6/7 @ 7:30 - Ann notified pt  . rt femur fx     traction  . rt shoulder surgery pinning.        Allergies  Allergen Reactions  . Codeine     itching    Current Outpatient Prescriptions on File Prior to Visit  Medication Sig Dispense Refill  . amitriptyline (ELAVIL) 25 MG tablet Take 25 mg by mouth at bedtime.    Marland Kitchen amLODipine (NORVASC) 10 MG tablet Take 10 mg by mouth daily as needed (FOR EXTREMELY HIGH BP.).     Marland Kitchen baclofen (LIORESAL) 10 MG tablet Take 10 mg by mouth 3 (three) times daily.    Marland Kitchen diltiazem (CARDIZEM) 60 MG tablet Take 1 tablet (60 mg total) by mouth 3 (three)  times daily before meals. 90 tablet 5  . FLUoxetine (PROZAC) 20 MG capsule Take 1 capsule by mouth daily.  0  . naproxen (NAPROSYN) 500 MG tablet Take 500 mg by mouth 2 (two) times daily with a meal.    . pantoprazole (PROTONIX) 40 MG tablet Take 40 mg by mouth daily.     No current facility-administered medications on file prior to visit.        Objective:   Physical Exam Blood pressure (!) 158/80, pulse 64, temperature 98.1 F (36.7 C), height 6\' 1"  (1.854 m), weight 201 lb 6.4 oz (91.4 kg).  Alert and oriented. Skin warm and dry. Oral mucosa is moist.   . Sclera anicteric, conjunctivae is pink. Thyroid not enlarged. No cervical lymphadenopathy. Lungs clear. Heart regular rate and rhythm.  Abdomen is soft. Bowel sounds are positive. No hepatomegaly. No abdominal masses felt. No tenderness.  No edema to lower extremities.  .      Assessment & Plan:  Dysphagia: He says he feels 25% or more better with the Cardizem.  GERD: with breakthru. Will increase Protonix to BID for 4 months. ( I discussed with Dr. Laural Golden) Hepatitis C. Will get labs and [place] elastrogrpahy.

## 2016-07-18 NOTE — Patient Instructions (Signed)
Labs today. OV 3 months.  

## 2016-07-19 ENCOUNTER — Encounter (INDEPENDENT_AMBULATORY_CARE_PROVIDER_SITE_OTHER): Payer: Self-pay | Admitting: Internal Medicine

## 2016-07-19 DIAGNOSIS — I10 Essential (primary) hypertension: Secondary | ICD-10-CM | POA: Diagnosis not present

## 2016-07-19 DIAGNOSIS — R05 Cough: Secondary | ICD-10-CM | POA: Diagnosis not present

## 2016-07-19 DIAGNOSIS — G4733 Obstructive sleep apnea (adult) (pediatric): Secondary | ICD-10-CM | POA: Diagnosis not present

## 2016-07-19 DIAGNOSIS — M545 Low back pain: Secondary | ICD-10-CM | POA: Diagnosis not present

## 2016-07-19 LAB — PROTIME-INR
INR: 1
PROTHROMBIN TIME: 10.3 s (ref 9.0–11.5)

## 2016-07-19 LAB — DRUG ABUSE PANEL 10-50 NO CONF, U
AMPHETAMINES (1000 ng/mL SCRN): NEGATIVE
BARBITURATES: NEGATIVE
BENZODIAZEPINES: NEGATIVE
COCAINE METABOLITES: NEGATIVE
MARIJUANA MET (50 ng/mL SCRN): NEGATIVE
METHADONE: NEGATIVE
METHAQUALONE: NEGATIVE
OPIATES: NEGATIVE
PHENCYCLIDINE: NEGATIVE
PROPOXYPHENE: NEGATIVE

## 2016-07-19 LAB — HEPATITIS PANEL, ACUTE
HCV Ab: REACTIVE — AB
HEP B C IGM: NONREACTIVE
Hep A IgM: NONREACTIVE
Hepatitis B Surface Ag: NEGATIVE

## 2016-07-19 LAB — HEPATIC FUNCTION PANEL
ALBUMIN: 4.3 g/dL (ref 3.6–5.1)
ALK PHOS: 100 U/L (ref 40–115)
ALT: 22 U/L (ref 9–46)
AST: 18 U/L (ref 10–35)
Bilirubin, Direct: 0.1 mg/dL (ref <=0.2)
Indirect Bilirubin: 0.5 mg/dL (ref 0.2–1.2)
Total Bilirubin: 0.6 mg/dL (ref 0.2–1.2)
Total Protein: 7.4 g/dL (ref 6.1–8.1)

## 2016-07-19 LAB — AFP TUMOR MARKER: AFP TUMOR MARKER: 6.5 ng/mL — AB (ref <6.1)

## 2016-07-20 LAB — HEPATITIS C RNA QUANTITATIVE
HCV Quantitative Log: 6.54 {Log} — ABNORMAL HIGH (ref <1.18)
HCV Quantitative: 3454575 IU/mL — ABNORMAL HIGH (ref <15)

## 2016-07-21 LAB — HEPATITIS C GENOTYPE

## 2016-07-24 ENCOUNTER — Ambulatory Visit (HOSPITAL_COMMUNITY)
Admission: RE | Admit: 2016-07-24 | Discharge: 2016-07-24 | Disposition: A | Discharge status: Home / Self Care | Payer: Medicare Other | Source: Ambulatory Visit | Attending: Internal Medicine | Admitting: Internal Medicine

## 2016-07-24 DIAGNOSIS — K760 Fatty (change of) liver, not elsewhere classified: Secondary | ICD-10-CM | POA: Diagnosis not present

## 2016-07-24 DIAGNOSIS — N281 Cyst of kidney, acquired: Secondary | ICD-10-CM | POA: Insufficient documentation

## 2016-07-24 DIAGNOSIS — B1920 Unspecified viral hepatitis C without hepatic coma: Secondary | ICD-10-CM | POA: Diagnosis not present

## 2016-07-25 ENCOUNTER — Telehealth (INDEPENDENT_AMBULATORY_CARE_PROVIDER_SITE_OTHER): Payer: Self-pay | Admitting: Internal Medicine

## 2016-07-25 NOTE — Telephone Encounter (Signed)
Christian Carrillo, Harvoni x 12 weeks.

## 2016-07-27 NOTE — Telephone Encounter (Signed)
A PA has been completed for the [facility]. Once we hear from [place]ondo we will contact the patient.

## 2016-08-10 ENCOUNTER — Telehealth (INDEPENDENT_AMBULATORY_CARE_PROVIDER_SITE_OTHER): Payer: Self-pay | Admitting: *Deleted

## 2016-08-10 ENCOUNTER — Other Ambulatory Visit (INDEPENDENT_AMBULATORY_CARE_PROVIDER_SITE_OTHER): Payer: Self-pay | Admitting: *Deleted

## 2016-08-10 DIAGNOSIS — B182 Chronic viral hepatitis C: Secondary | ICD-10-CM

## 2016-08-10 NOTE — Telephone Encounter (Signed)
Patient presented to the office today to pick up his Harvoni. He plans to start on 08/11/2016. Labs will be 09/08/2016 , they have been ordered. Patient will need a OV week of 09/11/2016.  Patient has been on Protonix twice a day. Per Lelon Perla the patient will need to stop that and he may take the Zantax OTC 150 mg. Patient needs to take these two medications 12 hours apart.  Patient will call if he has problems or concerns.

## 2016-08-21 ENCOUNTER — Encounter (INDEPENDENT_AMBULATORY_CARE_PROVIDER_SITE_OTHER): Payer: Self-pay | Admitting: *Deleted

## 2016-08-21 ENCOUNTER — Other Ambulatory Visit (INDEPENDENT_AMBULATORY_CARE_PROVIDER_SITE_OTHER): Payer: Self-pay | Admitting: *Deleted

## 2016-08-21 DIAGNOSIS — B182 Chronic viral hepatitis C: Secondary | ICD-10-CM

## 2016-08-28 ENCOUNTER — Telehealth (INDEPENDENT_AMBULATORY_CARE_PROVIDER_SITE_OTHER): Payer: Self-pay | Admitting: Internal Medicine

## 2016-08-28 NOTE — Telephone Encounter (Signed)
Patient will be called and made aware that the Mail Order Pharmacy will contact him , then [place] to arrange a date for his medication to be delivered.

## 2016-08-28 NOTE — Telephone Encounter (Signed)
Patient called, stated that he has just 9 pills left of Harvoni and needs a refill.  He would like for Tammy to call him when this has been done.  [phone]

## 2016-08-29 NOTE — Telephone Encounter (Signed)
Called [facility], the pharmacist is going to reach out to the patient and if all goes well they will sent his next medication to our office on Thursday,08/31/2016.

## 2016-09-07 ENCOUNTER — Other Ambulatory Visit (INDEPENDENT_AMBULATORY_CARE_PROVIDER_SITE_OTHER): Payer: Self-pay | Admitting: *Deleted

## 2016-09-07 DIAGNOSIS — B182 Chronic viral hepatitis C: Secondary | ICD-10-CM

## 2016-09-07 LAB — CBC
HCT: 41 % (ref 38.5–50.0)
Hemoglobin: 13.9 g/dL (ref 13.2–17.1)
MCH: 30.5 pg (ref 27.0–33.0)
MCHC: 33.9 g/dL (ref 32.0–36.0)
MCV: 89.9 fL (ref 80.0–100.0)
MPV: 10.7 fL (ref 7.5–12.5)
PLATELETS: 284 10*3/uL (ref 140–400)
RBC: 4.56 MIL/uL (ref 4.20–5.80)
RDW: 13.3 % (ref 11.0–15.0)
WBC: 12.3 10*3/uL — ABNORMAL HIGH (ref 3.8–10.8)

## 2016-09-07 LAB — COMPREHENSIVE METABOLIC PANEL
ALK PHOS: 126 U/L — AB (ref 40–115)
ALT: 16 U/L (ref 9–46)
AST: 11 U/L (ref 10–35)
Albumin: 4 g/dL (ref 3.6–5.1)
BUN: 14 mg/dL (ref 7–25)
CO2: 25 mmol/L (ref 20–31)
CREATININE: 0.96 mg/dL (ref 0.70–1.25)
Calcium: 9.2 mg/dL (ref 8.6–10.3)
Chloride: 102 mmol/L (ref 98–110)
Glucose, Bld: 82 mg/dL (ref 65–99)
Potassium: 4 mmol/L (ref 3.5–5.3)
SODIUM: 139 mmol/L (ref 135–146)
TOTAL PROTEIN: 7.2 g/dL (ref 6.1–8.1)
Total Bilirubin: 0.6 mg/dL (ref 0.2–1.2)

## 2016-09-08 LAB — HEPATITIS C RNA QUANTITATIVE
HCV QUANT: 24 [IU]/mL — AB (ref <15)
HCV Quantitative Log: 1.38 {Log} — ABNORMAL HIGH (ref <1.18)

## 2016-09-11 ENCOUNTER — Other Ambulatory Visit (INDEPENDENT_AMBULATORY_CARE_PROVIDER_SITE_OTHER): Payer: Self-pay | Admitting: *Deleted

## 2016-09-11 ENCOUNTER — Encounter (INDEPENDENT_AMBULATORY_CARE_PROVIDER_SITE_OTHER): Payer: Self-pay | Admitting: *Deleted

## 2016-09-11 ENCOUNTER — Telehealth (INDEPENDENT_AMBULATORY_CARE_PROVIDER_SITE_OTHER): Payer: Self-pay | Admitting: Internal Medicine

## 2016-09-11 DIAGNOSIS — B182 Chronic viral hepatitis C: Secondary | ICD-10-CM

## 2016-09-14 NOTE — Telephone Encounter (Signed)
error 

## 2016-09-26 ENCOUNTER — Telehealth (INDEPENDENT_AMBULATORY_CARE_PROVIDER_SITE_OTHER): Payer: Self-pay | Admitting: Internal Medicine

## 2016-09-26 LAB — HEPATITIS C RNA QUANTITATIVE: HCV QUANT: NOT DETECTED [IU]/mL (ref <15)

## 2016-09-26 NOTE — Telephone Encounter (Signed)
Patient called, stated that he had labs done yesterday.  Wants to know if the results are back and if he can get his Harvoni refilled.  [phone]

## 2016-09-26 NOTE — Telephone Encounter (Signed)
This has been sent to [facility]. Patient is to call  The mail order pharmacy , and let them know that it is time for a refill on this medication. They will call our office to make a date and time for the delivery here, and we will let the patient know.

## 2016-10-03 DIAGNOSIS — M545 Low back pain: Secondary | ICD-10-CM | POA: Diagnosis not present

## 2016-10-03 DIAGNOSIS — M5431 Sciatica, right side: Secondary | ICD-10-CM | POA: Diagnosis not present

## 2016-10-10 DIAGNOSIS — H524 Presbyopia: Secondary | ICD-10-CM | POA: Diagnosis not present

## 2016-10-18 ENCOUNTER — Encounter (INDEPENDENT_AMBULATORY_CARE_PROVIDER_SITE_OTHER): Payer: Self-pay

## 2016-10-18 ENCOUNTER — Ambulatory Visit (INDEPENDENT_AMBULATORY_CARE_PROVIDER_SITE_OTHER): Payer: Medicare Other | Admitting: Internal Medicine

## 2016-10-18 ENCOUNTER — Other Ambulatory Visit (INDEPENDENT_AMBULATORY_CARE_PROVIDER_SITE_OTHER): Payer: Self-pay | Admitting: *Deleted

## 2016-10-18 ENCOUNTER — Encounter (INDEPENDENT_AMBULATORY_CARE_PROVIDER_SITE_OTHER): Payer: Self-pay | Admitting: Internal Medicine

## 2016-10-18 VITALS — BP 142/68 | HR 80 | Temp 98.6°F | Ht 73.0 in | Wt 216.6 lb

## 2016-10-18 DIAGNOSIS — B182 Chronic viral hepatitis C: Secondary | ICD-10-CM

## 2016-10-18 DIAGNOSIS — R1310 Dysphagia, unspecified: Secondary | ICD-10-CM | POA: Diagnosis not present

## 2016-10-18 DIAGNOSIS — R1319 Other dysphagia: Secondary | ICD-10-CM

## 2016-10-18 DIAGNOSIS — B1920 Unspecified viral hepatitis C without hepatic coma: Secondary | ICD-10-CM | POA: Diagnosis not present

## 2016-10-18 NOTE — Patient Instructions (Signed)
Labs in 8 weeks. OV in 8 weeks.

## 2016-10-18 NOTE — Progress Notes (Signed)
Subjective:    Patient ID: Christian Carrillo, male    DOB: 31-Aug-1951, 65 y.o.   MRN: [id number]  HPI  Here today for f/u. Hx of Hepatitis C. Genotype 1A. Started treatment 08/11/2016. Has 15 more days and he will be finished. 09/25/2016 Hep C quaint undetected. Protonix is on hold due to the Ray.  07/24/2016 [place] Elast F3-F4 Appetite is good. He has gained 15 pounds since his visit in August.  States has been on Prednisone for back pain. He has a BM daily. No melena or BRRB.  No rash.    He underwent and EGD and Esophageal manometry in June of this year.  which revealed a Nutcracker esophagus. Weight 201 in August.   Presently taking Cardizem 60mg  TID.  He says sometimes lodge when he first began to eat.  He is chewing his food good. Dentition is very poor.    CBC    Component Value Date/Time   WBC 12.3 (H) 09/07/2016 1154   RBC 4.56 09/07/2016 1154   HGB 13.9 09/07/2016 1154   HCT 41.0 09/07/2016 1154   PLT 284 09/07/2016 1154   MCV 89.9 09/07/2016 1154   MCH 30.5 09/07/2016 1154   MCHC 33.9 09/07/2016 1154   RDW 13.3 09/07/2016 1154   LYMPHSABS 3,325 07/18/2016 1530   MONOABS 950 07/18/2016 1530   EOSABS 380 07/18/2016 1530   BASOSABS 0 07/18/2016 1530   Hepatic Function Panel     Component Value Date/Time   PROT 7.2 09/07/2016 1139   ALBUMIN 4.0 09/07/2016 1139   AST 11 09/07/2016 1139   ALT 16 09/07/2016 1139   ALKPHOS 126 (H) 09/07/2016 1139   BILITOT 0.6 09/07/2016 1139   BILIDIR 0.1 07/18/2016 1530   IBILI 0.5 07/18/2016 1530      05/03/2016 EGD/ED: dysphagia: GERD:  Impression: - Normal esophagus. - Mild Schatzki ring. Dilated. - 3 cm hiatal hernia. - Normal stomach. - Normal duodenal bulb and second portion of the  duodenum. - No specimens collected.  05/22/2016 Esophageal Manometry:  dysphagia. Nutcrackers esophagus.    Review of Systems Past Medical History:  Diagnosis Date  . Anxiety   . Arthritis   . Chronic back pain   . COPD (chronic obstructive pulmonary disease) ([place])   . Depression   . GERD (gastroesophageal reflux disease)   . Hepatitis   . Hypertension   . Shortness of breath dyspnea     Past Surgical History:  Procedure Laterality Date  . BACK SURGERY    . ESOPHAGOGASTRODUODENOSCOPY N/A 05/03/2016   Procedure: ESOPHAGOGASTRODUODENOSCOPY (EGD);  Surgeon: Rogene Houston, MD;  [facility];  Service: Endoscopy;  Laterality: N/A;  1:00 - moved to 6/7 @ 7:30 - Ann notified pt  . rt femur fx     traction  . rt shoulder surgery pinning.       Allergies  Allergen Reactions  . Codeine     itching    Current Outpatient Prescriptions on File Prior to Visit  Medication Sig Dispense Refill  . amitriptyline (ELAVIL) 25 MG tablet Take 25 mg by mouth at bedtime.    Marland Kitchen amLODipine (NORVASC) 10 MG tablet Take 10 mg by mouth daily as needed (FOR EXTREMELY HIGH BP.).     Marland Kitchen baclofen (LIORESAL) 10 MG tablet Take 10 mg by mouth 3 (three) times daily.    Marland Kitchen diltiazem (CARDIZEM) 60 MG tablet Take 1 tablet (60 mg total) by mouth 3 (three) times daily before meals. 90 tablet 5  .  FLUoxetine (PROZAC) 20 MG capsule Take 1 capsule by mouth daily.  0  . hydrochlorothiazide (MICROZIDE) 12.5 MG capsule Take 12.5 mg by mouth daily.    . naproxen (NAPROSYN) 500 MG tablet Take 500 mg by mouth 2 (two) times daily with a meal.    . pantoprazole (PROTONIX) 40 MG tablet Take 40 mg by mouth daily.    . pantoprazole (PROTONIX) 40 MG tablet Take 1 tablet (40 mg total) by mouth 2 (two) times daily before a meal. 60 tablet 3   No current facility-administered medications on file prior to visit.        Objective:   Physical Exam Blood pressure (!) 142/68, pulse 80, temperature 98.6 F (37 C), height 6\' 1"  (1.854 m), weight 216 lb 9.6 oz (98.2 kg).  Alert and oriented.  Skin warm and dry. Oral mucosa is moist.   . Sclera anicteric, conjunctivae is pink. Thyroid not enlarged. No cervical lymphadenopathy. Lungs clear. Heart regular rate and rhythm.  Abdomen is soft. Bowel sounds are positive. No hepatomegaly. No abdominal masses felt. No tenderness.  No edema to lower extremities.         Assessment & Plan:   Hepatitis C. He has cleared the virus. He is doing well. OV in 8 weeks. CBC, Hepatic function, Hep C quaint in 8 weeks.  Occasionally dysphagia. Make sure u chew your foods well. Will start Protonix after tx of his Hepatitis C.

## 2016-10-25 ENCOUNTER — Telehealth (INDEPENDENT_AMBULATORY_CARE_PROVIDER_SITE_OTHER): Payer: Self-pay | Admitting: Internal Medicine

## 2016-10-25 DIAGNOSIS — B1920 Unspecified viral hepatitis C without hepatic coma: Secondary | ICD-10-CM

## 2016-10-25 DIAGNOSIS — R140 Abdominal distension (gaseous): Secondary | ICD-10-CM

## 2016-10-25 LAB — COMPREHENSIVE METABOLIC PANEL
ALT: 31 U/L (ref 9–46)
AST: 14 U/L (ref 10–35)
Albumin: 3.6 g/dL (ref 3.6–5.1)
Alkaline Phosphatase: 101 U/L (ref 40–115)
BUN: 17 mg/dL (ref 7–25)
CALCIUM: 8.7 mg/dL (ref 8.6–10.3)
CO2: 25 mmol/L (ref 20–31)
Chloride: 99 mmol/L (ref 98–110)
Creat: 1.02 mg/dL (ref 0.70–1.25)
GLUCOSE: 141 mg/dL — AB (ref 65–99)
POTASSIUM: 4 mmol/L (ref 3.5–5.3)
Sodium: 133 mmol/L — ABNORMAL LOW (ref 135–146)
Total Bilirubin: 1.2 mg/dL (ref 0.2–1.2)
Total Protein: 6.6 g/dL (ref 6.1–8.1)

## 2016-10-25 NOTE — Telephone Encounter (Signed)
[place] sch'd 10/27/16 at 930 (915), npo after midnight, patient aware

## 2016-10-25 NOTE — Telephone Encounter (Signed)
Christian Carrillo,  US abdomen ordered

## 2016-10-27 ENCOUNTER — Ambulatory Visit (HOSPITAL_COMMUNITY)
Admission: RE | Admit: 2016-10-27 | Discharge: 2016-10-27 | Disposition: A | Discharge status: Home / Self Care | Payer: Medicare Other | Source: Ambulatory Visit | Attending: Internal Medicine | Admitting: Internal Medicine

## 2016-10-27 DIAGNOSIS — N281 Cyst of kidney, acquired: Secondary | ICD-10-CM | POA: Insufficient documentation

## 2016-10-27 DIAGNOSIS — B1920 Unspecified viral hepatitis C without hepatic coma: Secondary | ICD-10-CM | POA: Insufficient documentation

## 2016-10-27 DIAGNOSIS — R140 Abdominal distension (gaseous): Secondary | ICD-10-CM | POA: Diagnosis not present

## 2016-10-31 ENCOUNTER — Ambulatory Visit (INDEPENDENT_AMBULATORY_CARE_PROVIDER_SITE_OTHER): Payer: Medicare Other | Admitting: Internal Medicine

## 2016-11-13 DIAGNOSIS — B356 Tinea cruris: Secondary | ICD-10-CM | POA: Diagnosis not present

## 2016-11-13 DIAGNOSIS — M545 Low back pain: Secondary | ICD-10-CM | POA: Diagnosis not present

## 2016-11-13 DIAGNOSIS — R05 Cough: Secondary | ICD-10-CM | POA: Diagnosis not present

## 2016-11-13 DIAGNOSIS — I10 Essential (primary) hypertension: Secondary | ICD-10-CM | POA: Diagnosis not present

## 2016-11-13 DIAGNOSIS — G4733 Obstructive sleep apnea (adult) (pediatric): Secondary | ICD-10-CM | POA: Diagnosis not present

## 2016-11-14 DIAGNOSIS — H25011 Cortical age-related cataract, right eye: Secondary | ICD-10-CM | POA: Diagnosis not present

## 2016-11-14 DIAGNOSIS — H2511 Age-related nuclear cataract, right eye: Secondary | ICD-10-CM | POA: Diagnosis not present

## 2016-11-14 DIAGNOSIS — H25811 Combined forms of age-related cataract, right eye: Secondary | ICD-10-CM | POA: Diagnosis not present

## 2016-11-22 ENCOUNTER — Other Ambulatory Visit (INDEPENDENT_AMBULATORY_CARE_PROVIDER_SITE_OTHER): Payer: Self-pay | Admitting: *Deleted

## 2016-11-22 ENCOUNTER — Encounter (INDEPENDENT_AMBULATORY_CARE_PROVIDER_SITE_OTHER): Payer: Self-pay | Admitting: *Deleted

## 2016-11-22 DIAGNOSIS — B182 Chronic viral hepatitis C: Secondary | ICD-10-CM

## 2016-12-07 DIAGNOSIS — Z961 Presence of intraocular lens: Secondary | ICD-10-CM | POA: Diagnosis not present

## 2016-12-15 LAB — CBC
HCT: 42 % (ref 38.5–50.0)
Hemoglobin: 14.2 g/dL (ref 13.2–17.1)
MCH: 30.6 pg (ref 27.0–33.0)
MCHC: 33.8 g/dL (ref 32.0–36.0)
MCV: 90.5 fL (ref 80.0–100.0)
MPV: 10.9 fL (ref 7.5–12.5)
Platelets: 258 10*3/uL (ref 140–400)
RBC: 4.64 MIL/uL (ref 4.20–5.80)
RDW: 13.9 % (ref 11.0–15.0)
WBC: 9.5 10*3/uL (ref 3.8–10.8)

## 2016-12-15 LAB — HEPATIC FUNCTION PANEL
ALBUMIN: 4 g/dL (ref 3.6–5.1)
ALK PHOS: 115 U/L (ref 40–115)
ALT: 15 U/L (ref 9–46)
AST: 12 U/L (ref 10–35)
BILIRUBIN TOTAL: 0.5 mg/dL (ref 0.2–1.2)
Bilirubin, Direct: 0.1 mg/dL (ref <=0.2)
Indirect Bilirubin: 0.4 mg/dL (ref 0.2–1.2)
Total Protein: 7.2 g/dL (ref 6.1–8.1)

## 2016-12-19 LAB — HEPATITIS C RNA QUANTITATIVE
HCV Quantitative Log: <1.18 Log IU/mL (ref <1.18)
HCV Quantitative: <15 IU/mL (ref <15)

## 2017-01-10 ENCOUNTER — Ambulatory Visit (INDEPENDENT_AMBULATORY_CARE_PROVIDER_SITE_OTHER): Payer: Medicare Other | Admitting: Internal Medicine

## 2017-01-10 ENCOUNTER — Other Ambulatory Visit (INDEPENDENT_AMBULATORY_CARE_PROVIDER_SITE_OTHER): Payer: Self-pay | Admitting: *Deleted

## 2017-01-10 ENCOUNTER — Encounter (INDEPENDENT_AMBULATORY_CARE_PROVIDER_SITE_OTHER): Payer: Self-pay

## 2017-01-10 ENCOUNTER — Encounter (INDEPENDENT_AMBULATORY_CARE_PROVIDER_SITE_OTHER): Payer: Self-pay | Admitting: Internal Medicine

## 2017-01-10 VITALS — BP 130/82 | HR 64 | Temp 97.5°F | Ht 73.0 in | Wt 221.3 lb

## 2017-01-10 DIAGNOSIS — B1920 Unspecified viral hepatitis C without hepatic coma: Secondary | ICD-10-CM | POA: Insufficient documentation

## 2017-01-10 DIAGNOSIS — R1310 Dysphagia, unspecified: Secondary | ICD-10-CM

## 2017-01-10 DIAGNOSIS — B182 Chronic viral hepatitis C: Secondary | ICD-10-CM

## 2017-01-10 DIAGNOSIS — R1319 Other dysphagia: Secondary | ICD-10-CM

## 2017-01-10 NOTE — Patient Instructions (Signed)
OV in 6 months. 

## 2017-01-10 NOTE — Progress Notes (Signed)
Subjective:    Patient ID: Christian Carrillo, male    DOB: 1950-12-17, 66 y.o.   MRN: [id number]  HPI Here today for f/u. Hx of Hepatitis C. Successfully treated with Harvoni in 2017. I last saw him in November.  Genotype 1A. 07/24/2016 [place] elast F3-Fr.  12/15/2016 Hep C quaint undetected. He says he is doing good. He has been "clean" since April of 2017. His appetite is good. He  Has gained 5 pounds since his last visit. His swallowing is much better. Takes Cardizem TID.  He can eat for the most part what he wants. He chews his foods up well. BMs are normal. No melena.  He has no GI Complaints.     10/27/2016 US abdomen: Hep C;  IMPRESSION: Increased size of RIGHT renal cysts, which nevertheless appear simple and uncomplicated. Clinical significance doubtful.  No acute intra-abdominal findings.  He underwent and EGD and Esophageal manometry  esophagus.EGD revealed:                            - Mild Schatzki ring. Dilated.                           - 3 cm hiatal hernia.                           - Normal stomach.                           - Normal duodenal bulb and second portion of duodenum.  Manometry revealed nutcrackers esophagus.       Hepatic Function Panel     Component Value Date/Time   PROT 7.2 12/15/2016 1127   ALBUMIN 4.0 12/15/2016 1127   AST 12 12/15/2016 1127   ALT 15 12/15/2016 1127   ALKPHOS 115 12/15/2016 1127   BILITOT 0.5 12/15/2016 1127   BILIDIR 0.1 12/15/2016 1127   IBILI 0.4 12/15/2016 1127   CBC    Component Value Date/Time   WBC 9.5 12/15/2016 1127   RBC 4.64 12/15/2016 1127   HGB 14.2 12/15/2016 1127   HCT 42.0 12/15/2016 1127   PLT 258 12/15/2016 1127   MCV 90.5 12/15/2016 1127   MCH 30.6 12/15/2016 1127   MCHC 33.8 12/15/2016 1127   RDW 13.9 12/15/2016 1127   LYMPHSABS 3,325 07/18/2016 1530   MONOABS 950 07/18/2016 1530   EOSABS 380 07/18/2016 1530   BASOSABS 0 07/18/2016 1530      Review of Systems Past Medical History:    Diagnosis Date  . Anxiety   . Arthritis   . Chronic back pain   . COPD (chronic obstructive pulmonary disease) ([place])   . Depression   . GERD (gastroesophageal reflux disease)   . Hepatitis   . Hypertension   . Shortness of breath dyspnea     Past Surgical History:  Procedure Laterality Date  . BACK SURGERY    . cataract surgery     x 1 eye  . ESOPHAGOGASTRODUODENOSCOPY N/A 05/03/2016   Procedure: ESOPHAGOGASTRODUODENOSCOPY (EGD);  Surgeon: Rogene Houston, MD;  [facility];  Service: Endoscopy;  Laterality: N/A;  1:00 - moved to 6/7 @ 7:30 - Ann notified pt  . rt femur fx     traction  . rt shoulder surgery pinning.       Allergies  Allergen Reactions  . Codeine     itching    Current Outpatient Prescriptions on File Prior to Visit  Medication Sig Dispense Refill  . amitriptyline (ELAVIL) 25 MG tablet Take 25 mg by mouth at bedtime.    Marland Kitchen amLODipine (NORVASC) 10 MG tablet Take 10 mg by mouth daily as needed (FOR EXTREMELY HIGH BP.).     Marland Kitchen baclofen (LIORESAL) 10 MG tablet Take 10 mg by mouth 3 (three) times daily.    Marland Kitchen diltiazem (CARDIZEM) 60 MG tablet Take 1 tablet (60 mg total) by mouth 3 (three) times daily before meals. 90 tablet 5  . FLUoxetine (PROZAC) 20 MG capsule Take 1 capsule by mouth daily.  0  . hydrochlorothiazide (MICROZIDE) 12.5 MG capsule Take 12.5 mg by mouth daily.    . naproxen (NAPROSYN) 500 MG tablet Take 500 mg by mouth 2 (two) times daily with a meal.    . pantoprazole (PROTONIX) 40 MG tablet Take 1 tablet (40 mg total) by mouth 2 (two) times daily before a meal. 60 tablet 3   No current facility-administered medications on file prior to visit.        Objective:   Physical Exam Blood pressure 130/82, pulse 64, temperature 97.5 F (36.4 C), height 6\' 1"  (1.854 m), weight 221 lb 4.8 oz (100.4 kg).  Alert and oriented. Skin warm and dry. Oral mucosa is moist.   . Sclera anicteric, conjunctivae is pink. Thyroid not enlarged. No cervical  lymphadenopathy. Lungs clear. Heart regular rate and rhythm.  Abdomen is soft. Bowel sounds are positive. No hepatomegaly. No abdominal masses felt. No tenderness.  1+ edema to lower extremities.        Assessment & Plan:  Hepatitis C. He is doing well. He has cleared the virus. Will see back in 6 months. US abdomen to be scheduled in 6 months.  Dyshpaghia: He is doing well. He is chewing his food well.

## 2017-02-12 DIAGNOSIS — J438 Other emphysema: Secondary | ICD-10-CM | POA: Diagnosis not present

## 2017-02-12 DIAGNOSIS — I10 Essential (primary) hypertension: Secondary | ICD-10-CM | POA: Diagnosis not present

## 2017-02-12 DIAGNOSIS — R609 Edema, unspecified: Secondary | ICD-10-CM | POA: Diagnosis not present

## 2017-02-12 DIAGNOSIS — R600 Localized edema: Secondary | ICD-10-CM | POA: Diagnosis not present

## 2017-02-12 DIAGNOSIS — R0602 Shortness of breath: Secondary | ICD-10-CM | POA: Diagnosis not present

## 2017-02-15 DIAGNOSIS — I519 Heart disease, unspecified: Secondary | ICD-10-CM | POA: Diagnosis not present

## 2017-02-15 DIAGNOSIS — R600 Localized edema: Secondary | ICD-10-CM | POA: Diagnosis not present

## 2017-02-28 ENCOUNTER — Ambulatory Visit (INDEPENDENT_AMBULATORY_CARE_PROVIDER_SITE_OTHER): Payer: Medicare Other | Admitting: Cardiology

## 2017-02-28 ENCOUNTER — Encounter: Payer: Self-pay | Admitting: Cardiology

## 2017-02-28 ENCOUNTER — Encounter: Payer: Self-pay | Admitting: *Deleted

## 2017-02-28 VITALS — BP 156/81 | HR 56 | Ht 73.0 in | Wt 224.6 lb

## 2017-02-28 DIAGNOSIS — Q211 Atrial septal defect: Secondary | ICD-10-CM | POA: Diagnosis not present

## 2017-02-28 DIAGNOSIS — Q2112 Patent foramen ovale: Secondary | ICD-10-CM

## 2017-02-28 DIAGNOSIS — I10 Essential (primary) hypertension: Secondary | ICD-10-CM | POA: Diagnosis not present

## 2017-02-28 DIAGNOSIS — I5032 Chronic diastolic (congestive) heart failure: Secondary | ICD-10-CM

## 2017-02-28 MED ORDER — FUROSEMIDE 20 MG PO TABS: 20.0000 mg | ORAL_TABLET | Freq: Every day | ORAL | 1 refills | Status: DC | PRN

## 2017-02-28 NOTE — Progress Notes (Signed)
Clinical Summary Christian Carrillo is a 66 y.o.male seen as new patient, he is referred by Dr Quintin Alto.   1. LE edema - 02/15/17 echo at [facility]: LVEF >16%, grade I diastolic dysfunction, normal LA, normal RV, possible PFO - recent weight gain 40 lbs over 2 months per his report. Abdominal distension. LE edema - abdominal US without ascites.  - recently HCTZ was increased.    2. PFO - 02/15/17 echo at [facility]: LVEF >07%, grade I diastolic dysfunction, normal LA, normal RV, possible PFO  3. COPD - managed by pcp  4. History of hep C - followed by GI  5. HTN - compliant with meds - recent issues with high bp's.  - takes naproxen bid.  - home bp's 140/80s.    Past Medical History:  Diagnosis Date  . Anxiety   . Arthritis   . Chronic back pain   . COPD (chronic obstructive pulmonary disease) ([place])   . Depression   . GERD (gastroesophageal reflux disease)   . Hepatitis   . Hypertension   . Localized edema   . Other emphysema ([place])   . Shortness of breath dyspnea      Allergies  Allergen Reactions  . Codeine     itching     Current Outpatient Prescriptions  Medication Sig Dispense Refill  . amitriptyline (ELAVIL) 25 MG tablet Take 25 mg by mouth at bedtime.    Marland Kitchen amLODipine (NORVASC) 10 MG tablet Take 10 mg by mouth daily as needed (FOR EXTREMELY HIGH BP.).     Marland Kitchen baclofen (LIORESAL) 10 MG tablet Take 10 mg by mouth 3 (three) times daily.    Marland Kitchen diltiazem (CARDIZEM) 60 MG tablet Take 1 tablet (60 mg total) by mouth 3 (three) times daily before meals. 90 tablet 5  . FLUoxetine (PROZAC) 20 MG capsule Take 1 capsule by mouth daily.  0  . hydrochlorothiazide (MICROZIDE) 12.5 MG capsule Take 12.5 mg by mouth daily.    . naproxen (NAPROSYN) 500 MG tablet Take 500 mg by mouth 2 (two) times daily with a meal.    . pantoprazole (PROTONIX) 40 MG tablet Take 1 tablet (40 mg total) by mouth 2 (two) times daily before a meal. 60 tablet 3   No current facility-administered  medications for this visit.      Past Surgical History:  Procedure Laterality Date  . BACK SURGERY    . cataract surgery     x 1 eye  . ESOPHAGOGASTRODUODENOSCOPY N/A 05/03/2016   Procedure: ESOPHAGOGASTRODUODENOSCOPY (EGD);  Surgeon: Rogene Houston, MD;  [facility];  Service: Endoscopy;  Laterality: N/A;  1:00 - moved to 6/7 @ 7:30 - Ann notified pt  . rt femur fx     traction  . rt shoulder surgery pinning.        Allergies  Allergen Reactions  . Codeine     itching      Family History  Problem Relation Age of Onset  . Obesity Mother   . Hypothyroidism Mother   . Diabetes Mellitus II Mother   . Cancer Father   . Heart attack Brother   . Diabetes Mellitus II Brother      Social History Christian Carrillo reports that he quit smoking about a year ago. His smoking use included Cigarettes. He has a 30.00 pack-year smoking history. He has never used smokeless tobacco. Christian Carrillo reports that he does not drink alcohol.   Review of Systems CONSTITUTIONAL: No weight loss, fever,  chills, weakness or fatigue.  HEENT: Eyes: No visual loss, blurred vision, double vision or yellow sclerae.No hearing loss, sneezing, congestion, runny nose or sore throat.  SKIN: No rash or itching.  CARDIOVASCULAR: per hpi RESPIRATORY: No shortness of breath, cough or sputum.  GASTROINTESTINAL: No anorexia, nausea, vomiting or diarrhea. No abdominal pain or blood.  GENITOURINARY: No burning on urination, no polyuria NEUROLOGICAL: No headache, dizziness, syncope, paralysis, ataxia, numbness or tingling in the extremities. No change in bowel or bladder control.  MUSCULOSKELETAL: No muscle, back pain, joint pain or stiffness.  LYMPHATICS: No enlarged nodes. No history of splenectomy.  PSYCHIATRIC: No history of depression or anxiety.  ENDOCRINOLOGIC: No reports of sweating, cold or heat intolerance. No polyuria or polydipsia.  Marland Kitchen   Physical Examination Vitals:   02/28/17 1321 02/28/17  1329  BP: (!) 142/81 (!) 156/81  Pulse: (!) 55 (!) 56   Vitals:   02/28/17 1321  Weight: 224 lb 9.6 oz (101.9 kg)  Height: 6\' 1"  (1.854 m)    Gen: resting comfortably, no acute distress HEENT: no scleral icterus, pupils equal round and reactive, no palptable cervical adenopathy,  CV: RRR, no m/r/g, n ojvd Resp: Clear to auscultation bilaterally GI: abdomen is soft, non-tender, non-distended, normal bowel sounds, no hepatosplenomegaly MSK: extremities are warm, no edema.  Skin: warm, no rash Neuro:  no focal deficits Psych: appropriate affect     Assessment and Plan  1. Chronic diasotlic heart failure - we will start lasix 20mg  prn. Check BMET/Mg in 2 weeks.   2. PFO - counseled patient this is found in 25% of patients - in absence of right sided chamber enlargement/significnant shunt, or CVA no indication for further workup at this time - obtain echo images from outside hospital  3. HTN - elevated in clinic, he will submit bp log in 1 week.         Arnoldo Lenis, M.D.

## 2017-02-28 NOTE — Patient Instructions (Signed)
Your physician recommends that you schedule a follow-up appointment in: [place] has recommended you make the following change in your medication:   START LASIX 20 MG DAILY AS NEEDED FOR SWELLING  Your physician recommends that you return for lab work in: [place] BMP/MG  Your physician has requested that you regularly monitor and record your blood pressure readings at home FOR 1 [place]. Please use the same machine at the same time of day to check your readings and record them to bring to your follow-up visit.  Thank you for choosing [facility]!!

## 2017-03-07 ENCOUNTER — Telehealth: Payer: Self-pay | Admitting: *Deleted

## 2017-03-07 NOTE — Telephone Encounter (Signed)
Pt made aware

## 2017-03-07 NOTE — Telephone Encounter (Signed)
-----   Message from Arnoldo Lenis, MD sent at 03/07/2017  3:31 PM EDT ----- Bp log reviewed, bp's are up and down somewhat but overall look ok. Would not make any changes at this time    JBranch MD

## 2017-03-12 DIAGNOSIS — G4733 Obstructive sleep apnea (adult) (pediatric): Secondary | ICD-10-CM | POA: Diagnosis not present

## 2017-03-12 DIAGNOSIS — I10 Essential (primary) hypertension: Secondary | ICD-10-CM | POA: Diagnosis not present

## 2017-03-15 DIAGNOSIS — I10 Essential (primary) hypertension: Secondary | ICD-10-CM | POA: Diagnosis not present

## 2017-03-15 DIAGNOSIS — M545 Low back pain: Secondary | ICD-10-CM | POA: Diagnosis not present

## 2017-03-15 DIAGNOSIS — R05 Cough: Secondary | ICD-10-CM | POA: Diagnosis not present

## 2017-03-15 DIAGNOSIS — G4733 Obstructive sleep apnea (adult) (pediatric): Secondary | ICD-10-CM | POA: Diagnosis not present

## 2017-03-15 DIAGNOSIS — R600 Localized edema: Secondary | ICD-10-CM | POA: Diagnosis not present

## 2017-03-21 ENCOUNTER — Ambulatory Visit: Payer: Medicare Other | Admitting: Cardiology

## 2017-03-29 ENCOUNTER — Ambulatory Visit (INDEPENDENT_AMBULATORY_CARE_PROVIDER_SITE_OTHER): Payer: Medicare Other | Admitting: Cardiology

## 2017-03-29 ENCOUNTER — Encounter: Payer: Self-pay | Admitting: Cardiology

## 2017-03-29 VITALS — BP 171/81 | HR 54 | Ht 73.0 in | Wt 224.0 lb

## 2017-03-29 DIAGNOSIS — I5032 Chronic diastolic (congestive) heart failure: Secondary | ICD-10-CM

## 2017-03-29 DIAGNOSIS — I10 Essential (primary) hypertension: Secondary | ICD-10-CM | POA: Diagnosis not present

## 2017-03-29 NOTE — Patient Instructions (Signed)

## 2017-03-29 NOTE — Progress Notes (Signed)
Clinical Summary Christian Carrillo is a 66 y.o.male seen today for follow up of the following medical problems. This is a focused visit on LE edema and HTN.   1. LE edema/Chronic diastolic HF - 0/73/71 echo at [facility]: LVEF >06%, grade I diastolic dysfunction, normal LA, normal RV, possible PFO - recent weight gain 40 lbs over 2 months per his report. Abdominal distension. LE edema - abdominal US without ascites.   - last visit we started lasix 20mg  prn - leg swelling much improved. Took lasix x 10 days, now every other day.  - takes naproxen 500mg  bid. Has chronic back pain.   2. HTN - compliant with meds - recent issues with high bp's.  - takes naproxen bid.  - home bp's 140/80s.   - bp log 02/2017 reasonable control   Past Medical History:  Diagnosis Date  . Anxiety   . Arthritis   . Chronic back pain   . COPD (chronic obstructive pulmonary disease) ([place])   . Depression   . GERD (gastroesophageal reflux disease)   . Hepatitis   . Hypertension   . Localized edema   . Other emphysema ([place])   . Shortness of breath dyspnea      Allergies  Allergen Reactions  . Codeine     itching     Current Outpatient Prescriptions  Medication Sig Dispense Refill  . amitriptyline (ELAVIL) 25 MG tablet Take 25 mg by mouth at bedtime.    Marland Kitchen amLODipine (NORVASC) 10 MG tablet Take 10 mg by mouth daily.    . baclofen (LIORESAL) 10 MG tablet Take 10 mg by mouth 3 (three) times daily.    Marland Kitchen diltiazem (CARDIZEM) 60 MG tablet Take 1 tablet (60 mg total) by mouth 3 (three) times daily before meals. 90 tablet 5  . FLUoxetine (PROZAC) 20 MG capsule Take 1 capsule by mouth daily.  0  . furosemide (LASIX) 20 MG tablet Take 1 tablet (20 mg total) by mouth daily as needed. 90 tablet 1  . losartan-hydrochlorothiazide (HYZAAR) 100-25 MG tablet Take 1 tablet by mouth daily.    . naproxen (NAPROSYN) 500 MG tablet Take 500 mg by mouth 2 (two) times daily with a meal.    . pantoprazole (PROTONIX) 40  MG tablet Take 1 tablet (40 mg total) by mouth 2 (two) times daily before a meal. 60 tablet 3  . tamsulosin (FLOMAX) 0.4 MG CAPS capsule Take 0.4 mg by mouth.     No current facility-administered medications for this visit.      Past Surgical History:  Procedure Laterality Date  . BACK SURGERY    . cataract surgery     x 1 eye  . ESOPHAGOGASTRODUODENOSCOPY N/A 05/03/2016   Procedure: ESOPHAGOGASTRODUODENOSCOPY (EGD);  Surgeon: Rogene Houston, MD;  [facility];  Service: Endoscopy;  Laterality: N/A;  1:00 - moved to 6/7 @ 7:30 - Ann notified pt  . rt femur fx     traction  . rt shoulder surgery pinning.        Allergies  Allergen Reactions  . Codeine     itching      Family History  Problem Relation Age of Onset  . Obesity Mother   . Hypothyroidism Mother   . Diabetes Mellitus II Mother   . Cancer Father   . Heart attack Brother   . Diabetes Mellitus II Brother      Social History Christian Carrillo reports that he has been smoking Cigarettes.  He has a 60.00 pack-year smoking history. He has never used smokeless tobacco. Christian Carrillo reports that he does not drink alcohol.   Review of Systems CONSTITUTIONAL: No weight loss, fever, chills, weakness or fatigue.  HEENT: Eyes: No visual loss, blurred vision, double vision or yellow sclerae.No hearing loss, sneezing, congestion, runny nose or sore throat.  SKIN: No rash or itching.  CARDIOVASCULAR: per hpi RESPIRATORY: No shortness of breath, cough or sputum.  GASTROINTESTINAL: No anorexia, nausea, vomiting or diarrhea. No abdominal pain or blood.  GENITOURINARY: No burning on urination, no polyuria NEUROLOGICAL: No headache, dizziness, syncope, paralysis, ataxia, numbness or tingling in the extremities. No change in bowel or bladder control.  MUSCULOSKELETAL: No muscle, back pain, joint pain or stiffness.  LYMPHATICS: No enlarged nodes. No history of splenectomy.  PSYCHIATRIC: No history of depression or anxiety.    ENDOCRINOLOGIC: No reports of sweating, cold or heat intolerance. No polyuria or polydipsia.  Marland Kitchen   Physical Examination Vitals:   03/29/17 1116  BP: (!) 171/81  Pulse: (!) 54   Vitals:   03/29/17 1116  Weight: 224 lb (101.6 kg)  Height: 6\' 1"  (1.854 m)    Gen: resting comfortably, no acute distress HEENT: no scleral icterus, pupils equal round and reactive, no palptable cervical adenopathy,  CV: RRR, no m/r/g, no jvd Resp: Clear to auscultation bilaterally GI: abdomen is soft, non-tender, non-distended, normal bowel sounds, no hepatosplenomegaly MSK: extremities are warm, no edema.  Skin: warm, no rash Neuro:  no focal deficits Psych: appropriate affect   Diagnostic Studies     Assessment and Plan  1. Chronic diasotlic heart failure - symptoms improved, continue lasix    2. HTN - elevated in clinic, home numbers have been at goal - continue current meds  F/u 6 months.       Arnoldo Lenis, M.D.,

## 2017-04-10 DIAGNOSIS — M5441 Lumbago with sciatica, right side: Secondary | ICD-10-CM | POA: Diagnosis not present

## 2017-04-10 DIAGNOSIS — M542 Cervicalgia: Secondary | ICD-10-CM | POA: Diagnosis not present

## 2017-04-26 ENCOUNTER — Other Ambulatory Visit (INDEPENDENT_AMBULATORY_CARE_PROVIDER_SITE_OTHER): Payer: Self-pay | Admitting: Internal Medicine

## 2017-04-26 DIAGNOSIS — K219 Gastro-esophageal reflux disease without esophagitis: Secondary | ICD-10-CM

## 2017-05-10 DIAGNOSIS — H35031 Hypertensive retinopathy, right eye: Secondary | ICD-10-CM | POA: Diagnosis not present

## 2017-05-10 DIAGNOSIS — H34211 Partial retinal artery occlusion, right eye: Secondary | ICD-10-CM | POA: Diagnosis not present

## 2017-05-10 DIAGNOSIS — H43811 Vitreous degeneration, right eye: Secondary | ICD-10-CM | POA: Diagnosis not present

## 2017-05-10 DIAGNOSIS — Z961 Presence of intraocular lens: Secondary | ICD-10-CM | POA: Diagnosis not present

## 2017-05-17 ENCOUNTER — Encounter: Payer: Self-pay | Admitting: Adult Health

## 2017-05-17 ENCOUNTER — Ambulatory Visit (INDEPENDENT_AMBULATORY_CARE_PROVIDER_SITE_OTHER): Payer: Medicare Other | Admitting: Adult Health

## 2017-05-17 VITALS — BP 158/78 | HR 69 | Ht 72.0 in | Wt 220.0 lb

## 2017-05-17 DIAGNOSIS — E785 Hyperlipidemia, unspecified: Secondary | ICD-10-CM

## 2017-05-17 DIAGNOSIS — Q211 Atrial septal defect: Secondary | ICD-10-CM

## 2017-05-17 DIAGNOSIS — K219 Gastro-esophageal reflux disease without esophagitis: Secondary | ICD-10-CM | POA: Diagnosis not present

## 2017-05-17 DIAGNOSIS — H34212 Partial retinal artery occlusion, left eye: Secondary | ICD-10-CM

## 2017-05-17 DIAGNOSIS — R002 Palpitations: Secondary | ICD-10-CM

## 2017-05-17 DIAGNOSIS — I10 Essential (primary) hypertension: Secondary | ICD-10-CM | POA: Diagnosis not present

## 2017-05-17 DIAGNOSIS — Q2112 Patent foramen ovale: Secondary | ICD-10-CM

## 2017-05-17 MED ORDER — LOSARTAN POTASSIUM-HCTZ 100-25 MG PO TABS: 1.0000 | ORAL_TABLET | Freq: Every day | ORAL | 3 refills | Status: DC

## 2017-05-17 MED ORDER — ASPIRIN EC 81 MG PO TBEC: 81.0000 mg | DELAYED_RELEASE_TABLET | Freq: Every day | ORAL | 3 refills | Status: DC

## 2017-05-17 NOTE — Patient Instructions (Signed)
Medication Instructions:  Stop amlodipine  Start aspirin 81 mg daily   Labwork: asap  Testing/Procedures: none  Follow-Up: Your physician recommends that you schedule a follow-up appointment in: 3 weeks    Any Other Special Instructions Will Be Listed Below (If Applicable).     If you need a refill on your cardiac medications before your next appointment, please call your pharmacy.

## 2017-05-17 NOTE — Progress Notes (Addendum)
Cardiology Office Note   Date:  05/17/2017   ID:  Christian Carrillo, Christian Carrillo 1951-07-08, MRN [id number]  PCP:  Manon Hilding, MD  Cardiologist:  Lennie Muckle chief complaint on file.     History of Present Illness: Christian Carrillo is a 66 y.o. male who presents for ongoing assessment and management of chronic lower extremity edema, chronic diastolic heart failure, most recent echocardiogram March 2018 with normal LVEF and grade 1 diastolic dysfunction, hypertension, the patient was last seen in the office on 03/29/2017, symptoms had improved and he was to continue medication regimen as directed.  The patient was seen by Dr. Herbert Deaner, ophthalmologist, and found to have plaque in the right eye, and advised to follow-up with cardiology review of note from ophthalmologist office, states, "Hollenhorst plaque OD", patient reports questionable PFO, to follow-up with cardiology.  The patient has not been taking medications correctly. He ran out of ARB/HCTZ and has been taking amlodipine and diltiazem for blood pressure control. He is not on statin therapy in the setting of history of hepatitis C. The patient unfortunately continues to smoke. He denies any chest pain, dyspnea on exertion, or lower extremity edema. He has chronic back pain. He often takes multiple doses of Goody powders. .   Past Medical History:  Diagnosis Date  . Anxiety   . Arthritis   . Chronic back pain   . COPD (chronic obstructive pulmonary disease) ([place])   . Depression   . GERD (gastroesophageal reflux disease)   . Hepatitis   . Hypertension   . Localized edema   . Other emphysema ([place])   . Shortness of breath dyspnea     Past Surgical History:  Procedure Laterality Date  . BACK SURGERY    . cataract surgery     x 1 eye  . ESOPHAGOGASTRODUODENOSCOPY N/A 05/03/2016   Procedure: ESOPHAGOGASTRODUODENOSCOPY (EGD);  Surgeon: Rogene Houston, MD;  [facility];  Service: Endoscopy;  Laterality: N/A;  1:00 - moved to 6/7  @ 7:30 - Ann notified pt  . rt femur fx     traction  . rt shoulder surgery pinning.        Current Outpatient Prescriptions  Medication Sig Dispense Refill  . amitriptyline (ELAVIL) 25 MG tablet Take 25 mg by mouth at bedtime.    . baclofen (LIORESAL) 10 MG tablet Take 10 mg by mouth 3 (three) times daily.    Marland Kitchen diltiazem (CARDIZEM) 60 MG tablet take 1 tablet by mouth three times a day before meals 90 tablet 5  . FLUoxetine (PROZAC) 20 MG capsule Take 1 capsule by mouth daily.  0  . furosemide (LASIX) 20 MG tablet Take 1 tablet (20 mg total) by mouth daily as needed. 90 tablet 1  . losartan-hydrochlorothiazide (HYZAAR) 100-25 MG tablet Take 1 tablet by mouth daily. Take 1/2 tablet 90 tablet 3  . naproxen (NAPROSYN) 500 MG tablet Take 500 mg by mouth 2 (two) times daily with a meal.    . pantoprazole (PROTONIX) 40 MG tablet take 1 tablet by mouth twice a day before meals 60 tablet 3  . tamsulosin (FLOMAX) 0.4 MG CAPS capsule Take 0.4 mg by mouth.    Marland Kitchen aspirin EC 81 MG tablet Take 1 tablet (81 mg total) by mouth daily. 90 tablet 3   No current facility-administered medications for this visit.     Allergies:   Codeine    Social History:  The patient  reports that he has been smoking  Cigarettes.  He has a 15.00 pack-year smoking history. He has never used smokeless tobacco. He reports that he does not drink alcohol or use drugs.   Family History:  The patient's family history includes Cancer in his father; Diabetes Mellitus II in his brother and mother; Heart attack in his brother; Hypothyroidism in his mother; Obesity in his mother.    ROS: All other systems are reviewed and negative. Unless otherwise mentioned in H&P    PHYSICAL EXAM: VS:  BP (!) 158/78 (BP Location: Left Arm)   Pulse 69   Ht 6' (1.829 m)   Wt 220 lb (99.8 kg)   SpO2 96%   BMI 29.84 kg/m  , BMI Body mass index is 29.84 kg/m. GEN: Well nourished, well developed, in no acute distress  HEENT: normal left lid  swollen and slightly drooping. Neck: no JVD, carotid bruits, or masses Cardiac: RRR; no murmurs, rubs, or gallops,no edema  Respiratory:  clear to auscultation bilaterally, normal work of breathing GI: soft, nontender, nondistended, + BS MS: no deformity or atrophy  Skin: warm and dry, no rash Neuro:  Strength and sensation are intact Psych: euthymic mood, full affect   Recent Labs: 10/24/2016: BUN 17; Creat 1.02; Potassium 4.0; Sodium 133 12/15/2016: ALT 15; Hemoglobin 14.2; Platelets 258    Lipid Panel No results found for: CHOL, TRIG, HDL, CHOLHDL, VLDL, LDLCALC, LDLDIRECT    Wt Readings from Last 3 Encounters:  05/17/17 220 lb (99.8 kg)  03/29/17 224 lb (101.6 kg)  02/28/17 224 lb 9.6 oz (101.9 kg)      Other studies Reviewed: Echocardiogram February 26, 2017 Normal LV chamber size and wall thickness. LVEF > 12% Grade I Diastolic Dysfunction Normal bi-atrial size All valves without significant stenosis, or insufficiency Possible PFO Grossly Normal aorta.    ASSESSMENT AND PLAN:  1.  Plaque noted behind left eye: Described by Dr. Herbert Deaner was advised then to follow up with cardiology. The patient also states that he was told he had a blood clot in the vessels behind his eyes. I will start him on 81 mg aspirin daily. We'll discuss with Dr. branch need for further cardiac testing with "possible PFO". The patient is asymptomatic. Plaque was found incidentally on eye exam with complaints of floaters.  Addendum; I spoke with Dr. Harl Bowie about any further testing on this patient. He recommended carotid doppler studies for evaluation of plaque build up as possible source of Hollenhorst plaque. This will be ordered and patient will be notified.   2. Hypertension: Blood pressure is elevated today. He has not taken losartan HCTZ as directed as he has run out, and has been substituting amlodipine 5 mg for this in addition to taking diltiazem 60 mg 3 times a day. I refilled his losartan HCTZ,  although it has 7 refills we have called the pharmacy to make it available for him to pick up on his way home. He continues on Lasix 20 mg when necessary. I will check a BMET. I've advised him not to take Goody powders as these can cause blood pressure elevation and stomach irritation. CBC to evaluate for anemia with frequent use of Goody powder and abdominal discomfort  3. Risk stratification for hypercholesterolemia: Patient has not been on statin therapy due to hepatitis C. We'll check fasting lipids and LFTs, and consider pravastatin as this is not metabolized in the liver.  4. GERD: On PPI. Check magnesium with blood draw. CBC.   Current medicines are reviewed at length with the patient today.  Labs/ tests ordered today include: CBC, magnesium, BMET, fasting lipids and LFTs.  Phill Myron. West Pugh, ANP, AACC   05/17/2017 6:21 PM    [facility] [address], [place], [place] [address] Phone: [phone]; Fax: [phone]

## 2017-05-18 ENCOUNTER — Telehealth: Payer: Self-pay

## 2017-05-18 DIAGNOSIS — H539 Unspecified visual disturbance: Secondary | ICD-10-CM

## 2017-05-18 NOTE — Telephone Encounter (Signed)
Apt scheduled for 6/26 at 1230 pm, register at radiology at 1215, [id number]

## 2017-05-18 NOTE — Telephone Encounter (Signed)
-----   Message from Lendon Colonel, NP sent at 05/18/2017 12:03 PM EDT ----- Regarding: Carotid doppler  Please order bilateral carotid doppler studies on this patient and notify him that I spoke with Dr. Harl Bowie about his vision disturbances and plaque in the vessels of his left eye. We decided it would be best to get baseline carotid studies. This should be completed next week to have available for follow up visit.   Thank you

## 2017-05-22 ENCOUNTER — Ambulatory Visit (HOSPITAL_COMMUNITY): Admission: RE | Admit: 2017-05-22 | Payer: Medicare Other | Source: Ambulatory Visit

## 2017-05-22 ENCOUNTER — Ambulatory Visit (HOSPITAL_COMMUNITY)
Admission: RE | Admit: 2017-05-22 | Discharge: 2017-05-22 | Disposition: A | Discharge status: Home / Self Care | Payer: Medicare Other | Source: Ambulatory Visit | Attending: Adult Health | Admitting: Adult Health

## 2017-05-22 ENCOUNTER — Telehealth: Payer: Self-pay | Admitting: *Deleted

## 2017-05-22 DIAGNOSIS — R002 Palpitations: Secondary | ICD-10-CM | POA: Diagnosis not present

## 2017-05-22 DIAGNOSIS — E785 Hyperlipidemia, unspecified: Secondary | ICD-10-CM | POA: Diagnosis not present

## 2017-05-22 DIAGNOSIS — H539 Unspecified visual disturbance: Secondary | ICD-10-CM | POA: Diagnosis not present

## 2017-05-22 DIAGNOSIS — I6523 Occlusion and stenosis of bilateral carotid arteries: Secondary | ICD-10-CM | POA: Diagnosis not present

## 2017-05-22 DIAGNOSIS — I10 Essential (primary) hypertension: Secondary | ICD-10-CM | POA: Diagnosis not present

## 2017-05-22 LAB — BASIC METABOLIC PANEL
BUN: 21 mg/dL (ref 7–25)
CALCIUM: 9.3 mg/dL (ref 8.6–10.3)
CHLORIDE: 104 mmol/L (ref 98–110)
CO2: 27 mmol/L (ref 20–31)
CREATININE: 1.31 mg/dL — AB (ref 0.70–1.25)
GLUCOSE: 99 mg/dL (ref 65–99)
Potassium: 3.7 mmol/L (ref 3.5–5.3)
Sodium: 140 mmol/L (ref 135–146)

## 2017-05-22 LAB — CBC WITH DIFFERENTIAL/PLATELET
BASOS ABS: 0 {cells}/uL (ref 0–200)
BASOS PCT: 0 %
EOS ABS: 465 {cells}/uL (ref 15–500)
Eosinophils Relative: 5 %
HEMATOCRIT: 42.4 % (ref 38.5–50.0)
HEMOGLOBIN: 13.9 g/dL (ref 13.2–17.1)
LYMPHS ABS: 2697 {cells}/uL (ref 850–3900)
Lymphocytes Relative: 29 %
MCH: 28.8 pg (ref 27.0–33.0)
MCHC: 32.8 g/dL (ref 32.0–36.0)
MCV: 88 fL (ref 80.0–100.0)
MONO ABS: 1116 {cells}/uL — AB (ref 200–950)
MONOS PCT: 12 %
MPV: 11 fL (ref 7.5–12.5)
NEUTROS ABS: 5022 {cells}/uL (ref 1500–7800)
Neutrophils Relative %: 54 %
PLATELETS: 230 10*3/uL (ref 140–400)
RBC: 4.82 MIL/uL (ref 4.20–5.80)
RDW: 15 % (ref 11.0–15.0)
WBC: 9.3 10*3/uL (ref 3.8–10.8)

## 2017-05-22 LAB — LIPID PANEL
CHOLESTEROL: 221 mg/dL — AB (ref <200)
HDL: 28 mg/dL — AB (ref >40)
LDL Cholesterol: 156 mg/dL — ABNORMAL HIGH (ref <100)
Total CHOL/HDL Ratio: 7.9 Ratio — ABNORMAL HIGH (ref <5.0)
Triglycerides: 187 mg/dL — ABNORMAL HIGH (ref <150)
VLDL: 37 mg/dL — ABNORMAL HIGH (ref <30)

## 2017-05-22 LAB — HEPATIC FUNCTION PANEL
ALBUMIN: 3.9 g/dL (ref 3.6–5.1)
ALT: 10 U/L (ref 9–46)
AST: 10 U/L (ref 10–35)
Alkaline Phosphatase: 121 U/L — ABNORMAL HIGH (ref 40–115)
Bilirubin, Direct: 0.1 mg/dL (ref <=0.2)
Indirect Bilirubin: 0.6 mg/dL (ref 0.2–1.2)
TOTAL PROTEIN: 6.9 g/dL (ref 6.1–8.1)
Total Bilirubin: 0.7 mg/dL (ref 0.2–1.2)

## 2017-05-22 LAB — MAGNESIUM: Magnesium: 1.9 mg/dL (ref 1.5–2.5)

## 2017-05-22 NOTE — Telephone Encounter (Signed)
-----   Message from Lendon Colonel, NP sent at 05/22/2017 11:38 AM EDT ----- Good report. No further testing.

## 2017-05-22 NOTE — Telephone Encounter (Signed)
Called patient with test results. No answer. Left message to call back.  

## 2017-05-23 ENCOUNTER — Telehealth: Payer: Self-pay | Admitting: *Deleted

## 2017-05-23 MED ORDER — PRAVASTATIN SODIUM 40 MG PO TABS: 40.0000 mg | ORAL_TABLET | Freq: Every evening | ORAL | 3 refills | Status: DC

## 2017-05-23 MED ORDER — LOSARTAN POTASSIUM 100 MG PO TABS: 100.0000 mg | ORAL_TABLET | Freq: Every day | ORAL | 3 refills | Status: DC

## 2017-05-23 NOTE — Telephone Encounter (Signed)
-----   Message from Lendon Colonel, NP sent at 05/22/2017  5:52 PM EDT ----- Begin pravastatin 40 mg po daily for elevated cholesterol and plaque noted in left eye. This will not affect his liver function. Continue other medications as directed with the ex  Cholesterol is elevated. Please begin pravastatin 40 mg po daily. This will not affect his liver function in the setting of hx of prior Hep C. Creatinine is elevated. Stop Hyzaar. Begin Cozaar 100 mg daily instead. Just removing the HCTZ portion of the medication as he is also taking lasix prn. No evidence of anemia. Awaiting doppler ultrasound of carotids.

## 2017-05-23 NOTE — Telephone Encounter (Signed)
Called patient with test results. No answer. Left message to call back.  

## 2017-06-07 ENCOUNTER — Encounter (INDEPENDENT_AMBULATORY_CARE_PROVIDER_SITE_OTHER): Payer: Medicare Other | Admitting: Ophthalmology

## 2017-06-07 ENCOUNTER — Encounter: Payer: Self-pay | Admitting: Adult Health

## 2017-06-07 ENCOUNTER — Ambulatory Visit (INDEPENDENT_AMBULATORY_CARE_PROVIDER_SITE_OTHER): Payer: Medicare Other | Admitting: Adult Health

## 2017-06-07 VITALS — BP 144/80 | HR 61 | Ht 73.0 in | Wt 220.0 lb

## 2017-06-07 DIAGNOSIS — H34211 Partial retinal artery occlusion, right eye: Secondary | ICD-10-CM

## 2017-06-07 DIAGNOSIS — I10 Essential (primary) hypertension: Secondary | ICD-10-CM | POA: Diagnosis not present

## 2017-06-07 DIAGNOSIS — H35031 Hypertensive retinopathy, right eye: Secondary | ICD-10-CM

## 2017-06-07 DIAGNOSIS — E784 Other hyperlipidemia: Secondary | ICD-10-CM

## 2017-06-07 DIAGNOSIS — H34212 Partial retinal artery occlusion, left eye: Secondary | ICD-10-CM

## 2017-06-07 DIAGNOSIS — H43811 Vitreous degeneration, right eye: Secondary | ICD-10-CM

## 2017-06-07 DIAGNOSIS — J410 Simple chronic bronchitis: Secondary | ICD-10-CM | POA: Diagnosis not present

## 2017-06-07 DIAGNOSIS — E7849 Other hyperlipidemia: Secondary | ICD-10-CM

## 2017-06-07 DIAGNOSIS — E7800 Pure hypercholesterolemia, unspecified: Secondary | ICD-10-CM | POA: Insufficient documentation

## 2017-06-07 NOTE — Progress Notes (Signed)
Cardiology Office Note   Date:  06/07/2017   ID:  Christian Carrillo, Christian Carrillo 06/16/1951, MRN [id number]  PCP:  Manon Hilding, MD  Cardiologist:  [facility]  Chief Complaint  Patient presents with  . Hyperlipidemia  . Hypertension  . Congestive Heart Failure      History of Present Illness: Christian Carrillo is a 66 y.o. male who presents for ongoing assessment and management of chronic diastolic heart failure, chronic lower extremity edema, grade 1 diastolic function, hypertension. The patient was last in the office on 05/17/2017 had been seen by ophthalmologist and found to have plaque in the right eye and was advised to follow-up with cardiology.  The patient was started on a 1 mg of aspirin daily, I discussed the case with Dr. Harl Bowie that any further testing. The patient had carotid Doppler ultrasound, and started on statin therapy. Fasting lipids LFTs and magnesium were ordered.  Doppler Ultrasound 05/22/2017  IMPRESSION: Minor carotid atherosclerosis. No hemodynamically significant ICA stenosis. Degree of narrowing less than 50% bilaterally.  Labs: Total cholesterol 221, HDL 28, LDL 156 triglycerides 187. Magnesium 1.9. LFTs within normal limits  He is doing well. He states that he saw his eye doctor earlier today and was told that the plaque in his eye is improved.   He complains of LEE but no dyspnea, or chest pain.   Past Medical History:  Diagnosis Date  . Anxiety   . Arthritis   . Chronic back pain   . COPD (chronic obstructive pulmonary disease) ([place])   . Depression   . GERD (gastroesophageal reflux disease)   . Hepatitis   . Hypertension   . Localized edema   . Other emphysema ([place])   . Shortness of breath dyspnea     Past Surgical History:  Procedure Laterality Date  . BACK SURGERY    . cataract surgery     x 1 eye  . ESOPHAGOGASTRODUODENOSCOPY N/A 05/03/2016   Procedure: ESOPHAGOGASTRODUODENOSCOPY (EGD);  Surgeon: Rogene Houston, MD;  [facility];   Service: Endoscopy;  Laterality: N/A;  1:00 - moved to 6/7 @ 7:30 - Ann notified pt  . rt femur fx     traction  . rt shoulder surgery pinning.        Current Outpatient Prescriptions  Medication Sig Dispense Refill  . amitriptyline (ELAVIL) 25 MG tablet Take 25 mg by mouth at bedtime.    Marland Kitchen aspirin EC 81 MG tablet Take 1 tablet (81 mg total) by mouth daily. 90 tablet 3  . baclofen (LIORESAL) 10 MG tablet Take 10 mg by mouth 3 (three) times daily.    Marland Kitchen diltiazem (CARDIZEM) 60 MG tablet take 1 tablet by mouth three times a day before meals 90 tablet 5  . FLUoxetine (PROZAC) 20 MG capsule Take 1 capsule by mouth daily.  0  . furosemide (LASIX) 20 MG tablet Take 1 tablet (20 mg total) by mouth daily as needed. 90 tablet 1  . losartan (COZAAR) 100 MG tablet Take 1 tablet (100 mg total) by mouth daily. 90 tablet 3  . naproxen (NAPROSYN) 500 MG tablet Take 500 mg by mouth 2 (two) times daily with a meal.    . pantoprazole (PROTONIX) 40 MG tablet take 1 tablet by mouth twice a day before meals 60 tablet 3  . pravastatin (PRAVACHOL) 40 MG tablet Take 1 tablet (40 mg total) by mouth every evening. 90 tablet 3  . tamsulosin (FLOMAX) 0.4 MG CAPS capsule Take 0.4 mg  by mouth.     No current facility-administered medications for this visit.     Allergies:   Codeine    Social History:  The patient  reports that he has been smoking Cigarettes.  He has a 15.00 pack-year smoking history. He has never used smokeless tobacco. He reports that he does not drink alcohol or use drugs.   Family History:  The patient's family history includes Cancer in his father; Diabetes Mellitus II in his brother and mother; Heart attack in his brother; Hypothyroidism in his mother; Obesity in his mother.    ROS: All other systems are reviewed and negative. Unless otherwise mentioned in H&P    PHYSICAL EXAM: VS:  BP (!) 144/80   Pulse 61   Ht 6\' 1"  (1.854 m)   Wt 220 lb (99.8 kg)   SpO2 92%   BMI 29.03 kg/m  , BMI  Body mass index is 29.03 kg/m. GEN: Well nourished, well developed, in no acute distress  HEENT: normal  Neck: no JVD, carotid bruits, or masses Cardiac: RRR; no murmurs, rubs, or gallops,non-pitting  edema  Respiratory:  Some crackles in the bases, cleared with coughing.  GI: soft, nontender, nondistended, + BS MS: no deformity or atrophy  Skin: warm and dry, no rash Neuro:  Strength and sensation are intact Psych: euthymic mood, full affect   Recent Labs: 05/22/2017: ALT 10; BUN 21; Creat 1.31; Hemoglobin 13.9; Magnesium 1.9; Platelets 230; Potassium 3.7; Sodium 140    Lipid Panel    Component Value Date/Time   CHOL 221 (H) 05/22/2017 1003   TRIG 187 (H) 05/22/2017 1003   HDL 28 (L) 05/22/2017 1003   CHOLHDL 7.9 (H) 05/22/2017 1003   VLDL 37 (H) 05/22/2017 1003   LDLCALC 156 (H) 05/22/2017 1003      Wt Readings from Last 3 Encounters:  06/07/17 220 lb (99.8 kg)  05/17/17 220 lb (99.8 kg)  03/29/17 224 lb (101.6 kg)      Other studies Reviewed:  Echocardiogram 02/15/2017 Normal LV chamber size and wall thickness. LVEF > 34% Grade I Diastolic Dysfunction Normal bi-atrial size All valves without significant stenosis, or insufficiency Possible PFO Grossly Normal aorta.   ASSESSMENT AND PLAN:  1.Chronic Diastolic CHF: No evidence of decompensation currently. He is advised that the puffiness in his ankles may be related to diltiazem and naproxen. He is to continue taking lasix 20 mg daily. No further cardiac testing. BMET in 3 months,   I gave asked him to walk a minimum of 15 minutes a day and then increase to 30 minutes a day after 2-3 weeks. This will help with weight loss and circulation. He verbalizes understanding.   2. Hypercholesterolemia: Continue statin therapy. He will have follow up lipids and LFT's in 3 3 months.   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 Continue statin and ASA.   4. Questionable PFO: He is asymptomatic. Follow  up with Dr. Harl Bowie to discuss TEE if clinically indicated.   5. Ongoing tobacco abuse: He is counseled on smoking cessation.    Current medicines are reviewed at length with the patient today.    Labs/ tests ordered today include: BMET and Lipid panel in 3 months.  Phill Myron. West Pugh, ANP, AACC   06/07/2017 2:27 PM    [facility] [address] [place], [place] [address] Phone: [phone]; Fax: [phone]

## 2017-06-07 NOTE — Patient Instructions (Signed)
Your physician recommends that you schedule a follow-up appointment in: 3 Months with Dr. Harl Bowie   Your physician recommends that you continue on your current medications as directed. Please refer to the Current Medication list given to you today.  Your physician discussed the importance of regular exercise and recommended that you start or continue a regular exercise program for good health.  Your physician recommends that you return for lab work in: Fasting just before your next visit.   If you need a refill on your cardiac medications before your next appointment, please call your pharmacy.  Thank you for choosing [facility]!

## 2017-06-20 DIAGNOSIS — M545 Low back pain: Secondary | ICD-10-CM | POA: Diagnosis not present

## 2017-06-20 DIAGNOSIS — M542 Cervicalgia: Secondary | ICD-10-CM | POA: Diagnosis not present

## 2017-06-27 DIAGNOSIS — I10 Essential (primary) hypertension: Secondary | ICD-10-CM | POA: Diagnosis not present

## 2017-06-27 DIAGNOSIS — E784 Other hyperlipidemia: Secondary | ICD-10-CM | POA: Diagnosis not present

## 2017-06-27 DIAGNOSIS — I5032 Chronic diastolic (congestive) heart failure: Secondary | ICD-10-CM | POA: Diagnosis not present

## 2017-06-27 DIAGNOSIS — K210 Gastro-esophageal reflux disease with esophagitis: Secondary | ICD-10-CM | POA: Diagnosis not present

## 2017-07-02 DIAGNOSIS — I10 Essential (primary) hypertension: Secondary | ICD-10-CM | POA: Diagnosis not present

## 2017-07-02 DIAGNOSIS — R600 Localized edema: Secondary | ICD-10-CM | POA: Diagnosis not present

## 2017-07-02 DIAGNOSIS — G4733 Obstructive sleep apnea (adult) (pediatric): Secondary | ICD-10-CM | POA: Diagnosis not present

## 2017-07-02 DIAGNOSIS — Z1389 Encounter for screening for other disorder: Secondary | ICD-10-CM | POA: Diagnosis not present

## 2017-07-02 DIAGNOSIS — Z0001 Encounter for general adult medical examination with abnormal findings: Secondary | ICD-10-CM | POA: Diagnosis not present

## 2017-07-02 DIAGNOSIS — M545 Low back pain: Secondary | ICD-10-CM | POA: Diagnosis not present

## 2017-07-03 ENCOUNTER — Ambulatory Visit (INDEPENDENT_AMBULATORY_CARE_PROVIDER_SITE_OTHER): Payer: Medicare Other | Admitting: Internal Medicine

## 2017-07-09 ENCOUNTER — Telehealth: Payer: Self-pay | Admitting: Adult Health

## 2017-07-09 DIAGNOSIS — I10 Essential (primary) hypertension: Secondary | ICD-10-CM

## 2017-07-09 NOTE — Telephone Encounter (Signed)
Please follow up with patient tomorrow AM to see how pcp visit went and what changes were made  Zandra Abts MD

## 2017-07-09 NOTE — Telephone Encounter (Signed)
Spoke with wife and she c/o that BP has been high since last visit. Patient is using a wrist BP monitor to check his BP and says that it hasn't been checked for accuracy in the last 6 months and also confirmed that the monitor is very old.   BP @8 :15 am today was 192/117 & 8:45 am 194/112 & @8 :48 am 194/110. Patient c/o Back, neck, and shoulder pain that is constant rated 9/10. Patient is seeing a neurosurgeon on 07/19/17 for the pain to get injections but doesn't feel that he can wait that long.  No c/o chest pain, h/a, n/v, blurred vision or dizziness. C/O sob that is worse than before.   Wife and patient advised that he could come in for a nurse BP check tomorrow at 4:15 pm and also advised that he could contact his PCP for an evaluation. Wife declined nurse visit for tomorrow and said that she would contact his PCP for a nurse visit today. Wife advised that she could call back if she wanted the nurse visit appointment for tomorrow. Wife verbalized understanding of plan.

## 2017-07-09 NOTE — Telephone Encounter (Signed)
Patient's BP this morning was 192/112. Has been running high in the AM's. / tg

## 2017-07-10 MED ORDER — CHLORTHALIDONE 25 MG PO TABS: 12.5000 mg | ORAL_TABLET | Freq: Every day | ORAL | 3 refills | Status: DC

## 2017-07-10 NOTE — Telephone Encounter (Signed)
Amlodopine and diltiazem are essentially the same thing, I would not start norvasc. I would have him take his lasix prn only, start chlorthalidone 12.5mg  daily, this is a fluid pill and also a bp pill. Needs BMET and Mg in 1 week. Update [place] on bp's on Monday  Zandra Abts MD

## 2017-07-10 NOTE — Telephone Encounter (Signed)
Pt made aware of medication changes. Made sure he understood not to take norvasc and to only take lasix as needed. He voiced understanding. He will get labs done in 1 week .

## 2017-07-10 NOTE — Addendum Note (Signed)
Addended byDebbora Lacrosse R on: 07/10/2017 02:02 PM   Modules accepted: Orders

## 2017-07-10 NOTE — Telephone Encounter (Signed)
Pt is taking losartan 100 mg daily. He had started taking his lasix 20 mg daily last month. He states that he is in awful pain with his back, and is scheduled to begin injections on 07/25/17 for that. He has Norvasc 10 mg @ home from where his blood pressure had gotten really high before, and was wondering if that would help him. I told him not to take that until I verified it with Dr. Harl Bowie.

## 2017-07-10 NOTE — Telephone Encounter (Signed)
Called pt this morning. He did not go see his pcp. He stated that he could not afford to pay for the visit. He did take his blood pressure to [facility] to see if his machine would match their readings. In a 40 minute span his readings were: (theirs- 215/107,206/119)(his- 191/101,173/102) He states that upon getting out of bed this morning his bp was 172/98 using his blood pressure machine. He stated he did not want to waste all his gas running to [place] to calibrate his machine.

## 2017-07-10 NOTE — Telephone Encounter (Signed)
Can we verify is patient is taking losartan 100mg  daily or is it losartan combined with HCTZ as was described in his previous clinic notes. How often is he taking his prn lasix. Depending on these answers we will need to make some changes   Carlyle Dolly MD

## 2017-07-16 DIAGNOSIS — I10 Essential (primary) hypertension: Secondary | ICD-10-CM | POA: Diagnosis not present

## 2017-07-25 DIAGNOSIS — M961 Postlaminectomy syndrome, not elsewhere classified: Secondary | ICD-10-CM | POA: Diagnosis not present

## 2017-09-10 ENCOUNTER — Ambulatory Visit: Payer: Medicare Other | Admitting: Cardiology

## 2017-09-13 ENCOUNTER — Ambulatory Visit: Payer: Medicare Other | Admitting: Cardiology

## 2017-10-26 DIAGNOSIS — G4733 Obstructive sleep apnea (adult) (pediatric): Secondary | ICD-10-CM | POA: Diagnosis not present

## 2017-10-26 DIAGNOSIS — E7849 Other hyperlipidemia: Secondary | ICD-10-CM | POA: Diagnosis not present

## 2017-10-26 DIAGNOSIS — E7841 Elevated Lipoprotein(a): Secondary | ICD-10-CM | POA: Diagnosis not present

## 2017-10-30 DIAGNOSIS — I10 Essential (primary) hypertension: Secondary | ICD-10-CM | POA: Diagnosis not present

## 2017-10-30 DIAGNOSIS — R600 Localized edema: Secondary | ICD-10-CM | POA: Diagnosis not present

## 2017-10-30 DIAGNOSIS — M545 Low back pain: Secondary | ICD-10-CM | POA: Diagnosis not present

## 2017-10-30 DIAGNOSIS — I5032 Chronic diastolic (congestive) heart failure: Secondary | ICD-10-CM | POA: Diagnosis not present

## 2017-10-30 DIAGNOSIS — G4733 Obstructive sleep apnea (adult) (pediatric): Secondary | ICD-10-CM | POA: Diagnosis not present

## 2017-11-12 DIAGNOSIS — I10 Essential (primary) hypertension: Secondary | ICD-10-CM | POA: Diagnosis not present

## 2018-02-21 DIAGNOSIS — I5032 Chronic diastolic (congestive) heart failure: Secondary | ICD-10-CM | POA: Diagnosis not present

## 2018-02-21 DIAGNOSIS — K210 Gastro-esophageal reflux disease with esophagitis: Secondary | ICD-10-CM | POA: Diagnosis not present

## 2018-02-21 DIAGNOSIS — R5383 Other fatigue: Secondary | ICD-10-CM | POA: Diagnosis not present

## 2018-02-21 DIAGNOSIS — I10 Essential (primary) hypertension: Secondary | ICD-10-CM | POA: Diagnosis not present

## 2018-02-21 DIAGNOSIS — R7301 Impaired fasting glucose: Secondary | ICD-10-CM | POA: Diagnosis not present

## 2018-02-26 DIAGNOSIS — M545 Low back pain: Secondary | ICD-10-CM | POA: Diagnosis not present

## 2018-02-26 DIAGNOSIS — I10 Essential (primary) hypertension: Secondary | ICD-10-CM | POA: Diagnosis not present

## 2018-02-26 DIAGNOSIS — R600 Localized edema: Secondary | ICD-10-CM | POA: Diagnosis not present

## 2018-02-26 DIAGNOSIS — I5032 Chronic diastolic (congestive) heart failure: Secondary | ICD-10-CM | POA: Diagnosis not present

## 2018-02-26 DIAGNOSIS — G4733 Obstructive sleep apnea (adult) (pediatric): Secondary | ICD-10-CM | POA: Diagnosis not present

## 2018-03-28 ENCOUNTER — Other Ambulatory Visit (INDEPENDENT_AMBULATORY_CARE_PROVIDER_SITE_OTHER): Payer: Self-pay | Admitting: Internal Medicine

## 2018-05-29 ENCOUNTER — Other Ambulatory Visit: Payer: Self-pay | Admitting: Adult Health

## 2018-05-29 ENCOUNTER — Other Ambulatory Visit (INDEPENDENT_AMBULATORY_CARE_PROVIDER_SITE_OTHER): Payer: Self-pay | Admitting: Internal Medicine

## 2018-06-24 DIAGNOSIS — J438 Other emphysema: Secondary | ICD-10-CM | POA: Diagnosis not present

## 2018-06-24 DIAGNOSIS — R5383 Other fatigue: Secondary | ICD-10-CM | POA: Diagnosis not present

## 2018-06-24 DIAGNOSIS — R7301 Impaired fasting glucose: Secondary | ICD-10-CM | POA: Diagnosis not present

## 2018-06-24 DIAGNOSIS — E7841 Elevated Lipoprotein(a): Secondary | ICD-10-CM | POA: Diagnosis not present

## 2018-06-24 DIAGNOSIS — M5416 Radiculopathy, lumbar region: Secondary | ICD-10-CM | POA: Diagnosis not present

## 2018-06-24 DIAGNOSIS — G4733 Obstructive sleep apnea (adult) (pediatric): Secondary | ICD-10-CM | POA: Diagnosis not present

## 2018-06-28 DIAGNOSIS — I10 Essential (primary) hypertension: Secondary | ICD-10-CM | POA: Diagnosis not present

## 2018-06-28 DIAGNOSIS — Z0001 Encounter for general adult medical examination with abnormal findings: Secondary | ICD-10-CM | POA: Diagnosis not present

## 2018-07-13 DIAGNOSIS — M5416 Radiculopathy, lumbar region: Secondary | ICD-10-CM | POA: Diagnosis not present

## 2018-07-18 DIAGNOSIS — L240 Irritant contact dermatitis due to detergents: Secondary | ICD-10-CM | POA: Diagnosis not present

## 2018-08-19 DIAGNOSIS — L240 Irritant contact dermatitis due to detergents: Secondary | ICD-10-CM | POA: Diagnosis not present

## 2018-08-29 DIAGNOSIS — B029 Zoster without complications: Secondary | ICD-10-CM | POA: Diagnosis not present

## 2018-09-05 ENCOUNTER — Other Ambulatory Visit: Payer: Self-pay

## 2018-09-05 NOTE — Patient Outreach (Signed)
[facility]) Care Management  09/05/2018  KINCADE GRANBERG 05/26/1951 [id number]   Medication Adherence call to Mr. Orval Dortch patient did not answer patient is due on Losartan 100 mg and Pravastatin 40 mg under [place].   [facility] Management Direct Dial [phone]  Fax [phone] [email or web address]

## 2018-09-06 ENCOUNTER — Other Ambulatory Visit: Payer: Self-pay | Admitting: Adult Health

## 2018-10-10 DIAGNOSIS — H43811 Vitreous degeneration, right eye: Secondary | ICD-10-CM | POA: Diagnosis not present

## 2018-10-25 DIAGNOSIS — I10 Essential (primary) hypertension: Secondary | ICD-10-CM | POA: Diagnosis not present

## 2018-10-25 DIAGNOSIS — K210 Gastro-esophageal reflux disease with esophagitis: Secondary | ICD-10-CM | POA: Diagnosis not present

## 2018-10-25 DIAGNOSIS — I5032 Chronic diastolic (congestive) heart failure: Secondary | ICD-10-CM | POA: Diagnosis not present

## 2018-10-25 DIAGNOSIS — R7301 Impaired fasting glucose: Secondary | ICD-10-CM | POA: Diagnosis not present

## 2018-10-28 ENCOUNTER — Other Ambulatory Visit (INDEPENDENT_AMBULATORY_CARE_PROVIDER_SITE_OTHER): Payer: Self-pay | Admitting: Internal Medicine

## 2018-10-28 DIAGNOSIS — K219 Gastro-esophageal reflux disease without esophagitis: Secondary | ICD-10-CM

## 2018-10-29 DIAGNOSIS — R7301 Impaired fasting glucose: Secondary | ICD-10-CM | POA: Diagnosis not present

## 2018-10-29 DIAGNOSIS — Z1389 Encounter for screening for other disorder: Secondary | ICD-10-CM | POA: Diagnosis not present

## 2018-10-29 DIAGNOSIS — H9313 Tinnitus, bilateral: Secondary | ICD-10-CM | POA: Diagnosis not present

## 2018-10-29 DIAGNOSIS — I10 Essential (primary) hypertension: Secondary | ICD-10-CM | POA: Diagnosis not present

## 2018-10-29 DIAGNOSIS — R600 Localized edema: Secondary | ICD-10-CM | POA: Diagnosis not present

## 2018-10-29 DIAGNOSIS — I5032 Chronic diastolic (congestive) heart failure: Secondary | ICD-10-CM | POA: Diagnosis not present

## 2018-12-30 ENCOUNTER — Other Ambulatory Visit: Payer: Self-pay | Admitting: Adult Health

## 2018-12-30 NOTE — Telephone Encounter (Signed)
Rx request sent to pharmacy.  

## 2019-01-31 DIAGNOSIS — J0100 Acute maxillary sinusitis, unspecified: Secondary | ICD-10-CM | POA: Diagnosis not present

## 2019-01-31 DIAGNOSIS — R1110 Vomiting, unspecified: Secondary | ICD-10-CM | POA: Diagnosis not present

## 2019-01-31 DIAGNOSIS — R05 Cough: Secondary | ICD-10-CM | POA: Diagnosis not present

## 2019-02-28 ENCOUNTER — Other Ambulatory Visit: Payer: Self-pay | Admitting: Adult Health

## 2019-04-30 DIAGNOSIS — I10 Essential (primary) hypertension: Secondary | ICD-10-CM | POA: Diagnosis not present

## 2019-04-30 DIAGNOSIS — R7301 Impaired fasting glucose: Secondary | ICD-10-CM | POA: Diagnosis not present

## 2019-04-30 DIAGNOSIS — E7841 Elevated Lipoprotein(a): Secondary | ICD-10-CM | POA: Diagnosis not present

## 2019-04-30 DIAGNOSIS — K210 Gastro-esophageal reflux disease with esophagitis: Secondary | ICD-10-CM | POA: Diagnosis not present

## 2019-05-01 ENCOUNTER — Other Ambulatory Visit (INDEPENDENT_AMBULATORY_CARE_PROVIDER_SITE_OTHER): Payer: Self-pay | Admitting: Internal Medicine

## 2019-05-01 DIAGNOSIS — I5032 Chronic diastolic (congestive) heart failure: Secondary | ICD-10-CM | POA: Diagnosis not present

## 2019-05-01 DIAGNOSIS — R600 Localized edema: Secondary | ICD-10-CM | POA: Diagnosis not present

## 2019-05-01 DIAGNOSIS — I10 Essential (primary) hypertension: Secondary | ICD-10-CM | POA: Diagnosis not present

## 2019-05-01 DIAGNOSIS — H9313 Tinnitus, bilateral: Secondary | ICD-10-CM | POA: Diagnosis not present

## 2019-05-01 DIAGNOSIS — R7301 Impaired fasting glucose: Secondary | ICD-10-CM | POA: Diagnosis not present

## 2019-05-27 DIAGNOSIS — I10 Essential (primary) hypertension: Secondary | ICD-10-CM | POA: Diagnosis not present

## 2019-06-27 DIAGNOSIS — E7800 Pure hypercholesterolemia, unspecified: Secondary | ICD-10-CM | POA: Diagnosis not present

## 2019-06-27 DIAGNOSIS — I10 Essential (primary) hypertension: Secondary | ICD-10-CM | POA: Diagnosis not present

## 2019-07-01 ENCOUNTER — Other Ambulatory Visit: Payer: Self-pay | Admitting: Adult Health

## 2019-07-28 DIAGNOSIS — E1165 Type 2 diabetes mellitus with hyperglycemia: Secondary | ICD-10-CM | POA: Diagnosis not present

## 2019-07-28 DIAGNOSIS — I10 Essential (primary) hypertension: Secondary | ICD-10-CM | POA: Diagnosis not present

## 2019-08-12 DIAGNOSIS — I10 Essential (primary) hypertension: Secondary | ICD-10-CM | POA: Diagnosis not present

## 2019-08-12 DIAGNOSIS — G4733 Obstructive sleep apnea (adult) (pediatric): Secondary | ICD-10-CM | POA: Diagnosis not present

## 2019-08-12 DIAGNOSIS — I5032 Chronic diastolic (congestive) heart failure: Secondary | ICD-10-CM | POA: Diagnosis not present

## 2019-08-12 DIAGNOSIS — R600 Localized edema: Secondary | ICD-10-CM | POA: Diagnosis not present

## 2019-08-12 DIAGNOSIS — M545 Low back pain: Secondary | ICD-10-CM | POA: Diagnosis not present

## 2019-08-22 DIAGNOSIS — I10 Essential (primary) hypertension: Secondary | ICD-10-CM | POA: Diagnosis not present

## 2019-09-02 DIAGNOSIS — I10 Essential (primary) hypertension: Secondary | ICD-10-CM | POA: Diagnosis not present

## 2019-09-02 DIAGNOSIS — Z23 Encounter for immunization: Secondary | ICD-10-CM | POA: Diagnosis not present

## 2019-09-02 DIAGNOSIS — S60229A Contusion of unspecified hand, initial encounter: Secondary | ICD-10-CM | POA: Diagnosis not present

## 2019-09-24 DIAGNOSIS — I10 Essential (primary) hypertension: Secondary | ICD-10-CM | POA: Diagnosis not present

## 2019-09-26 DIAGNOSIS — I10 Essential (primary) hypertension: Secondary | ICD-10-CM | POA: Diagnosis not present

## 2019-09-26 DIAGNOSIS — E782 Mixed hyperlipidemia: Secondary | ICD-10-CM | POA: Diagnosis not present

## 2019-10-13 DIAGNOSIS — H524 Presbyopia: Secondary | ICD-10-CM | POA: Diagnosis not present

## 2019-10-17 DIAGNOSIS — R0602 Shortness of breath: Secondary | ICD-10-CM | POA: Diagnosis not present

## 2019-10-17 DIAGNOSIS — J441 Chronic obstructive pulmonary disease with (acute) exacerbation: Secondary | ICD-10-CM | POA: Diagnosis not present

## 2019-10-24 DIAGNOSIS — I10 Essential (primary) hypertension: Secondary | ICD-10-CM | POA: Diagnosis not present

## 2019-10-27 DIAGNOSIS — R7301 Impaired fasting glucose: Secondary | ICD-10-CM | POA: Diagnosis not present

## 2019-10-27 DIAGNOSIS — Z720 Tobacco use: Secondary | ICD-10-CM | POA: Diagnosis not present

## 2019-10-27 DIAGNOSIS — G4733 Obstructive sleep apnea (adult) (pediatric): Secondary | ICD-10-CM | POA: Diagnosis not present

## 2019-10-27 DIAGNOSIS — E119 Type 2 diabetes mellitus without complications: Secondary | ICD-10-CM | POA: Diagnosis not present

## 2019-10-27 DIAGNOSIS — I10 Essential (primary) hypertension: Secondary | ICD-10-CM | POA: Diagnosis not present

## 2019-10-27 DIAGNOSIS — E782 Mixed hyperlipidemia: Secondary | ICD-10-CM | POA: Diagnosis not present

## 2019-10-30 DIAGNOSIS — Z0001 Encounter for general adult medical examination with abnormal findings: Secondary | ICD-10-CM | POA: Diagnosis not present

## 2019-10-30 DIAGNOSIS — I10 Essential (primary) hypertension: Secondary | ICD-10-CM | POA: Diagnosis not present

## 2019-10-30 DIAGNOSIS — R079 Chest pain, unspecified: Secondary | ICD-10-CM | POA: Diagnosis not present

## 2019-10-30 DIAGNOSIS — I5032 Chronic diastolic (congestive) heart failure: Secondary | ICD-10-CM | POA: Diagnosis not present

## 2019-10-30 DIAGNOSIS — E1140 Type 2 diabetes mellitus with diabetic neuropathy, unspecified: Secondary | ICD-10-CM | POA: Diagnosis not present

## 2019-10-30 DIAGNOSIS — J449 Chronic obstructive pulmonary disease, unspecified: Secondary | ICD-10-CM | POA: Diagnosis not present

## 2019-11-05 ENCOUNTER — Other Ambulatory Visit: Payer: Self-pay | Admitting: Adult Health

## 2019-11-27 DIAGNOSIS — I10 Essential (primary) hypertension: Secondary | ICD-10-CM | POA: Diagnosis not present

## 2019-12-26 DIAGNOSIS — I10 Essential (primary) hypertension: Secondary | ICD-10-CM | POA: Diagnosis not present

## 2019-12-26 DIAGNOSIS — I5032 Chronic diastolic (congestive) heart failure: Secondary | ICD-10-CM | POA: Diagnosis not present

## 2019-12-30 ENCOUNTER — Telehealth (INDEPENDENT_AMBULATORY_CARE_PROVIDER_SITE_OTHER): Payer: Self-pay | Admitting: Gastroenterology

## 2019-12-30 NOTE — Telephone Encounter (Signed)
Please notify patient he has not been seen in our office in 3 years, can make a f/up appt for further refills or get from pcp. Thanks.

## 2020-01-12 DIAGNOSIS — T18128A Food in esophagus causing other injury, initial encounter: Secondary | ICD-10-CM | POA: Diagnosis not present

## 2020-01-12 DIAGNOSIS — Z79899 Other long term (current) drug therapy: Secondary | ICD-10-CM | POA: Diagnosis not present

## 2020-01-12 DIAGNOSIS — K222 Esophageal obstruction: Secondary | ICD-10-CM | POA: Diagnosis not present

## 2020-01-12 DIAGNOSIS — Z885 Allergy status to narcotic agent status: Secondary | ICD-10-CM | POA: Diagnosis not present

## 2020-01-12 DIAGNOSIS — I10 Essential (primary) hypertension: Secondary | ICD-10-CM | POA: Diagnosis not present

## 2020-01-16 DIAGNOSIS — R350 Frequency of micturition: Secondary | ICD-10-CM | POA: Diagnosis not present

## 2020-01-16 DIAGNOSIS — E7800 Pure hypercholesterolemia, unspecified: Secondary | ICD-10-CM | POA: Diagnosis not present

## 2020-01-16 DIAGNOSIS — Z299 Encounter for prophylactic measures, unspecified: Secondary | ICD-10-CM | POA: Diagnosis not present

## 2020-01-16 DIAGNOSIS — E119 Type 2 diabetes mellitus without complications: Secondary | ICD-10-CM | POA: Diagnosis not present

## 2020-01-16 DIAGNOSIS — F17210 Nicotine dependence, cigarettes, uncomplicated: Secondary | ICD-10-CM | POA: Diagnosis not present

## 2020-01-16 DIAGNOSIS — I10 Essential (primary) hypertension: Secondary | ICD-10-CM | POA: Diagnosis not present

## 2020-01-23 ENCOUNTER — Other Ambulatory Visit: Payer: Self-pay | Admitting: Adult Health

## 2020-01-23 DIAGNOSIS — I5032 Chronic diastolic (congestive) heart failure: Secondary | ICD-10-CM | POA: Diagnosis not present

## 2020-01-23 DIAGNOSIS — I10 Essential (primary) hypertension: Secondary | ICD-10-CM | POA: Diagnosis not present

## 2020-01-23 DIAGNOSIS — I110 Hypertensive heart disease with heart failure: Secondary | ICD-10-CM | POA: Diagnosis not present

## 2020-01-23 DIAGNOSIS — E7841 Elevated Lipoprotein(a): Secondary | ICD-10-CM | POA: Diagnosis not present

## 2020-01-26 ENCOUNTER — Other Ambulatory Visit: Payer: Self-pay

## 2020-01-26 ENCOUNTER — Encounter (INDEPENDENT_AMBULATORY_CARE_PROVIDER_SITE_OTHER): Payer: Self-pay | Admitting: Internal Medicine

## 2020-01-26 ENCOUNTER — Ambulatory Visit (INDEPENDENT_AMBULATORY_CARE_PROVIDER_SITE_OTHER): Payer: Medicare Other | Admitting: Internal Medicine

## 2020-01-26 VITALS — BP 158/72 | HR 55 | Temp 97.5°F | Ht 73.0 in | Wt 236.5 lb

## 2020-01-26 DIAGNOSIS — K219 Gastro-esophageal reflux disease without esophagitis: Secondary | ICD-10-CM | POA: Insufficient documentation

## 2020-01-26 DIAGNOSIS — R1310 Dysphagia, unspecified: Secondary | ICD-10-CM | POA: Diagnosis not present

## 2020-01-26 DIAGNOSIS — R1319 Other dysphagia: Secondary | ICD-10-CM

## 2020-01-26 MED ORDER — DILTIAZEM HCL 60 MG PO TABS: ORAL_TABLET | ORAL | 3 refills | Status: DC

## 2020-01-26 NOTE — Progress Notes (Signed)
Presenting complaint;  Follow-up for GERD.  Patient complains of dysphagia.  Database and subjective:  Patient is 69 year old Caucasian male who has chronic GERD and history of hepatitis C which has been treated and eradicated who is here for scheduled visit.  He was last seen in February 2018. He states he has been opioid free for 4 years. He rarely has heartburn since he has been on pantoprazole.  He needs new prescription.  He has been having difficulty swallowing for about 6 months.  He states he is a slow eater and tries to chew his food thoroughly.  He vomits usually once every 30 days.  He eats his supper at 8 PM and goes to bed 3 hours later.  He uses 3 pillows. His appetite is good.  He has gained 15 pounds since his last visit.  He blames weight gain on restrictions due to Covid. His bowels move daily.  He denies melena or rectal bleeding.  He underwent esophagogastroduodenoscopy with esophageal dilation back in June 2017.  He did not respond to dilation.  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  He has been using  calcium channel blocker and it has helped.    Current Medications: Outpatient Encounter Medications as of 01/26/2020  Medication Sig  . amitriptyline (ELAVIL) 25 MG tablet Take 25 mg by mouth at bedtime.  . Aspirin-Acetaminophen-Caffeine (GOODYS EXTRA STRENGTH PO) Take by mouth. This is the powder form , no more thatn 6 per day.  . baclofen (LIORESAL) 10 MG tablet Take 10 mg by mouth 3 (three) times daily.  . chlorthalidone (HYGROTON) 25 MG tablet Take 0.5 tablets (12.5 mg total) by mouth daily.  . cloNIDine (CATAPRES) 0.1 MG tablet Take 0.1 mg by mouth 2 (two) times daily.  Marland Kitchen diltiazem (CARDIZEM) 60 MG tablet TAKE 1 TABLET BY MOUTH THREE TIMES A DAY BEFORE MEALS  . FLUoxetine (PROZAC) 20 MG capsule Take 1 capsule by mouth daily.  Marland Kitchen losartan (COZAAR) 100 MG tablet TAKE 1 TABLET(100 MG) BY MOUTH DAILY  .  pantoprazole (PROTONIX) 40 MG tablet TAKE 1 TABLET BY MOUTH TWICE A DAY BEFORE MEALS  . pravastatin (PRAVACHOL) 40 MG tablet Take 1 tablet (40 mg total) by mouth daily. Please schedule annual appt for refills. [phone]. 1st attempt.  . tamsulosin (FLOMAX) 0.4 MG CAPS capsule Take 0.4 mg by mouth.  . furosemide (LASIX) 20 MG tablet Take 1 tablet (20 mg total) by mouth daily as needed.  . [DISCONTINUED] aspirin EC 81 MG tablet Take 1 tablet (81 mg total) by mouth daily. (Patient not taking: Reported on 01/26/2020)  . [DISCONTINUED] naproxen (NAPROSYN) 500 MG tablet Take 500 mg by mouth 2 (two) times daily with a meal.   No facility-administered encounter medications on file as of 01/26/2020.     Objective: Blood pressure (!) 158/72, pulse (!) 55, temperature (!) 97.5 F (36.4 C), temperature source Temporal, height 6\' 1"  (1.854 m), weight 236 lb 8 oz (107.3 kg). Patient is alert and in no acute distress. He is wearing a facial mask. Conjunctiva is pink. Sclera is nonicteric Oropharyngeal mucosa is normal. No neck masses or thyromegaly noted. Cardiac exam with regular rhythm normal S1 and S2. No murmur or gallop noted. Lungs are clear to auscultation. Abdomen is symmetrical soft and nontender without organomegaly or masses. No LE edema or clubbing noted.  Assessment:  #1.  Chronic GERD.  He is doing well with therapy but he is having sporadic vomiting  and he also has sporadic throat symptoms.  Last EGD in June 2017 revealed small sliding hiatal hernia but no evidence of erosive esophagitis or stricture.  I wonder if he has an underlying gastric motility disorder.  Will consider further work-up if vomiting frequency increases.  He may consider eating his supper at least 4 hours before he goes to bed.  #2.  Esophageal dysphagia.  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  Is unclear as to the cause of his dysphagia.  Need to rule out esophageal  diverticulum or even esophageal stricture.   Plan:  New prescription given for diltiazem 60 mg before each meal.  Prescription given for 3 months with 3 refills. Barium pill esophagogram to be scheduled. Further recommendations to follow. Office visit in 1 year.

## 2020-01-26 NOTE — Patient Instructions (Signed)
Try to eat supper at least 4 hours before you go to bed. Physician will call with results of barium study and make further recommendations.

## 2020-01-29 ENCOUNTER — Other Ambulatory Visit (HOSPITAL_COMMUNITY): Payer: Medicare Other

## 2020-01-30 ENCOUNTER — Other Ambulatory Visit: Payer: Self-pay

## 2020-01-30 ENCOUNTER — Ambulatory Visit (HOSPITAL_COMMUNITY)
Admission: RE | Admit: 2020-01-30 | Discharge: 2020-01-30 | Disposition: A | Discharge status: Home / Self Care | Payer: Medicare Other | Source: Ambulatory Visit | Attending: Internal Medicine | Admitting: Internal Medicine

## 2020-01-30 DIAGNOSIS — R1310 Dysphagia, unspecified: Secondary | ICD-10-CM | POA: Insufficient documentation

## 2020-02-02 ENCOUNTER — Encounter (INDEPENDENT_AMBULATORY_CARE_PROVIDER_SITE_OTHER): Payer: Self-pay | Admitting: *Deleted

## 2020-02-03 ENCOUNTER — Other Ambulatory Visit (INDEPENDENT_AMBULATORY_CARE_PROVIDER_SITE_OTHER): Payer: Self-pay | Admitting: *Deleted

## 2020-02-03 DIAGNOSIS — R1310 Dysphagia, unspecified: Secondary | ICD-10-CM

## 2020-02-03 DIAGNOSIS — R1319 Other dysphagia: Secondary | ICD-10-CM

## 2020-02-04 ENCOUNTER — Other Ambulatory Visit (INDEPENDENT_AMBULATORY_CARE_PROVIDER_SITE_OTHER): Payer: Self-pay | Admitting: *Deleted

## 2020-02-13 DIAGNOSIS — F17210 Nicotine dependence, cigarettes, uncomplicated: Secondary | ICD-10-CM | POA: Diagnosis not present

## 2020-02-13 DIAGNOSIS — Z299 Encounter for prophylactic measures, unspecified: Secondary | ICD-10-CM | POA: Diagnosis not present

## 2020-02-13 DIAGNOSIS — E7800 Pure hypercholesterolemia, unspecified: Secondary | ICD-10-CM | POA: Diagnosis not present

## 2020-02-13 DIAGNOSIS — I10 Essential (primary) hypertension: Secondary | ICD-10-CM | POA: Diagnosis not present

## 2020-02-13 DIAGNOSIS — G4700 Insomnia, unspecified: Secondary | ICD-10-CM | POA: Diagnosis not present

## 2020-02-13 DIAGNOSIS — Z2821 Immunization not carried out because of patient refusal: Secondary | ICD-10-CM | POA: Diagnosis not present

## 2020-02-13 DIAGNOSIS — R5383 Other fatigue: Secondary | ICD-10-CM | POA: Diagnosis not present

## 2020-02-16 ENCOUNTER — Other Ambulatory Visit (HOSPITAL_COMMUNITY)
Admission: RE | Admit: 2020-02-16 | Discharge: 2020-02-16 | Disposition: A | Discharge status: Home / Self Care | Payer: Medicare Other | Source: Ambulatory Visit | Attending: Internal Medicine | Admitting: Internal Medicine

## 2020-02-16 ENCOUNTER — Other Ambulatory Visit: Payer: Self-pay

## 2020-02-16 DIAGNOSIS — F419 Anxiety disorder, unspecified: Secondary | ICD-10-CM | POA: Diagnosis not present

## 2020-02-16 DIAGNOSIS — R1314 Dysphagia, pharyngoesophageal phase: Secondary | ICD-10-CM | POA: Diagnosis not present

## 2020-02-16 DIAGNOSIS — R5383 Other fatigue: Secondary | ICD-10-CM | POA: Diagnosis not present

## 2020-02-16 DIAGNOSIS — Z8249 Family history of ischemic heart disease and other diseases of the circulatory system: Secondary | ICD-10-CM | POA: Diagnosis not present

## 2020-02-16 DIAGNOSIS — K2970 Gastritis, unspecified, without bleeding: Secondary | ICD-10-CM | POA: Diagnosis not present

## 2020-02-16 DIAGNOSIS — Z01812 Encounter for preprocedural laboratory examination: Secondary | ICD-10-CM | POA: Insufficient documentation

## 2020-02-16 DIAGNOSIS — Z79899 Other long term (current) drug therapy: Secondary | ICD-10-CM | POA: Diagnosis not present

## 2020-02-16 DIAGNOSIS — K219 Gastro-esophageal reflux disease without esophagitis: Secondary | ICD-10-CM | POA: Diagnosis not present

## 2020-02-16 DIAGNOSIS — K449 Diaphragmatic hernia without obstruction or gangrene: Secondary | ICD-10-CM | POA: Diagnosis not present

## 2020-02-16 DIAGNOSIS — K759 Inflammatory liver disease, unspecified: Secondary | ICD-10-CM | POA: Diagnosis not present

## 2020-02-16 DIAGNOSIS — K319 Disease of stomach and duodenum, unspecified: Secondary | ICD-10-CM | POA: Diagnosis not present

## 2020-02-16 DIAGNOSIS — Z7982 Long term (current) use of aspirin: Secondary | ICD-10-CM | POA: Diagnosis not present

## 2020-02-16 DIAGNOSIS — F329 Major depressive disorder, single episode, unspecified: Secondary | ICD-10-CM | POA: Diagnosis not present

## 2020-02-16 DIAGNOSIS — K222 Esophageal obstruction: Secondary | ICD-10-CM | POA: Diagnosis not present

## 2020-02-16 DIAGNOSIS — K228 Other specified diseases of esophagus: Secondary | ICD-10-CM | POA: Diagnosis not present

## 2020-02-16 DIAGNOSIS — M1990 Unspecified osteoarthritis, unspecified site: Secondary | ICD-10-CM | POA: Diagnosis not present

## 2020-02-16 DIAGNOSIS — F17210 Nicotine dependence, cigarettes, uncomplicated: Secondary | ICD-10-CM | POA: Diagnosis not present

## 2020-02-16 DIAGNOSIS — E7800 Pure hypercholesterolemia, unspecified: Secondary | ICD-10-CM | POA: Diagnosis not present

## 2020-02-16 DIAGNOSIS — Z885 Allergy status to narcotic agent status: Secondary | ICD-10-CM | POA: Diagnosis not present

## 2020-02-16 DIAGNOSIS — I10 Essential (primary) hypertension: Secondary | ICD-10-CM | POA: Diagnosis not present

## 2020-02-16 DIAGNOSIS — Z20822 Contact with and (suspected) exposure to covid-19: Secondary | ICD-10-CM | POA: Insufficient documentation

## 2020-02-16 DIAGNOSIS — J449 Chronic obstructive pulmonary disease, unspecified: Secondary | ICD-10-CM | POA: Diagnosis not present

## 2020-02-16 LAB — SARS CORONAVIRUS 2 (TAT 6-24 HRS): SARS Coronavirus 2: NEGATIVE

## 2020-02-17 ENCOUNTER — Other Ambulatory Visit (INDEPENDENT_AMBULATORY_CARE_PROVIDER_SITE_OTHER): Payer: Self-pay | Admitting: *Deleted

## 2020-02-18 ENCOUNTER — Encounter (HOSPITAL_COMMUNITY): Payer: Self-pay | Admitting: Internal Medicine

## 2020-02-18 ENCOUNTER — Encounter (HOSPITAL_COMMUNITY)
Admission: RE | Disposition: A | Discharge status: Home / Self Care | Payer: Self-pay | Source: Home / Self Care | Attending: Internal Medicine

## 2020-02-18 ENCOUNTER — Other Ambulatory Visit: Payer: Self-pay

## 2020-02-18 ENCOUNTER — Ambulatory Visit (HOSPITAL_COMMUNITY)
Admission: RE | Admit: 2020-02-18 | Discharge: 2020-02-18 | Disposition: A | Discharge status: Home / Self Care | Payer: Medicare Other | Attending: Internal Medicine | Admitting: Internal Medicine

## 2020-02-18 DIAGNOSIS — Z20822 Contact with and (suspected) exposure to covid-19: Secondary | ICD-10-CM | POA: Diagnosis not present

## 2020-02-18 DIAGNOSIS — K449 Diaphragmatic hernia without obstruction or gangrene: Secondary | ICD-10-CM | POA: Insufficient documentation

## 2020-02-18 DIAGNOSIS — Z7982 Long term (current) use of aspirin: Secondary | ICD-10-CM | POA: Diagnosis not present

## 2020-02-18 DIAGNOSIS — R1319 Other dysphagia: Secondary | ICD-10-CM

## 2020-02-18 DIAGNOSIS — K228 Other specified diseases of esophagus: Secondary | ICD-10-CM | POA: Diagnosis not present

## 2020-02-18 DIAGNOSIS — Z885 Allergy status to narcotic agent status: Secondary | ICD-10-CM | POA: Insufficient documentation

## 2020-02-18 DIAGNOSIS — K222 Esophageal obstruction: Secondary | ICD-10-CM | POA: Insufficient documentation

## 2020-02-18 DIAGNOSIS — K759 Inflammatory liver disease, unspecified: Secondary | ICD-10-CM | POA: Insufficient documentation

## 2020-02-18 DIAGNOSIS — F419 Anxiety disorder, unspecified: Secondary | ICD-10-CM | POA: Insufficient documentation

## 2020-02-18 DIAGNOSIS — F17210 Nicotine dependence, cigarettes, uncomplicated: Secondary | ICD-10-CM | POA: Insufficient documentation

## 2020-02-18 DIAGNOSIS — J449 Chronic obstructive pulmonary disease, unspecified: Secondary | ICD-10-CM | POA: Diagnosis not present

## 2020-02-18 DIAGNOSIS — K219 Gastro-esophageal reflux disease without esophagitis: Secondary | ICD-10-CM | POA: Diagnosis not present

## 2020-02-18 DIAGNOSIS — K2970 Gastritis, unspecified, without bleeding: Secondary | ICD-10-CM | POA: Diagnosis not present

## 2020-02-18 DIAGNOSIS — I10 Essential (primary) hypertension: Secondary | ICD-10-CM | POA: Insufficient documentation

## 2020-02-18 DIAGNOSIS — M1990 Unspecified osteoarthritis, unspecified site: Secondary | ICD-10-CM | POA: Insufficient documentation

## 2020-02-18 DIAGNOSIS — Z79899 Other long term (current) drug therapy: Secondary | ICD-10-CM | POA: Diagnosis not present

## 2020-02-18 DIAGNOSIS — F329 Major depressive disorder, single episode, unspecified: Secondary | ICD-10-CM | POA: Insufficient documentation

## 2020-02-18 DIAGNOSIS — K3189 Other diseases of stomach and duodenum: Secondary | ICD-10-CM | POA: Diagnosis not present

## 2020-02-18 DIAGNOSIS — R1314 Dysphagia, pharyngoesophageal phase: Secondary | ICD-10-CM | POA: Insufficient documentation

## 2020-02-18 DIAGNOSIS — K319 Disease of stomach and duodenum, unspecified: Secondary | ICD-10-CM | POA: Insufficient documentation

## 2020-02-18 DIAGNOSIS — Z8249 Family history of ischemic heart disease and other diseases of the circulatory system: Secondary | ICD-10-CM | POA: Diagnosis not present

## 2020-02-18 DIAGNOSIS — R1310 Dysphagia, unspecified: Secondary | ICD-10-CM

## 2020-02-18 HISTORY — PX: ESOPHAGEAL DILATION: SHX303

## 2020-02-18 HISTORY — PX: ESOPHAGOGASTRODUODENOSCOPY: SHX5428

## 2020-02-18 HISTORY — PX: BIOPSY: SHX5522

## 2020-02-18 SURGERY — EGD (ESOPHAGOGASTRODUODENOSCOPY)
Anesthesia: Moderate Sedation

## 2020-02-18 MED ORDER — MIDAZOLAM HCL 5 MG/5ML IJ SOLN
INTRAMUSCULAR | Status: DC | PRN
  Administered 2020-02-18: 2 mg via INTRAVENOUS
  Administered 2020-02-18: 3 mg via INTRAVENOUS
  Administered 2020-02-18: 2 mg via INTRAVENOUS

## 2020-02-18 MED ORDER — MIDAZOLAM HCL 5 MG/5ML IJ SOLN
INTRAMUSCULAR | Status: AC
  Filled 2020-02-18: qty 10

## 2020-02-18 MED ORDER — STERILE WATER FOR IRRIGATION IR SOLN
Status: DC | PRN
  Administered 2020-02-18: 1.5 mL

## 2020-02-18 MED ORDER — LIDOCAINE VISCOUS HCL 2 % MT SOLN
OROMUCOSAL | Status: AC
  Filled 2020-02-18: qty 15

## 2020-02-18 MED ORDER — SODIUM CHLORIDE 0.9 % IV SOLN: INTRAVENOUS | Status: DC

## 2020-02-18 MED ORDER — LIDOCAINE VISCOUS HCL 2 % MT SOLN
OROMUCOSAL | Status: DC | PRN
  Administered 2020-02-18: 4 mL via OROMUCOSAL

## 2020-02-18 MED ORDER — MEPERIDINE HCL 50 MG/ML IJ SOLN
INTRAMUSCULAR | Status: AC
  Filled 2020-02-18: qty 1

## 2020-02-18 MED ORDER — MEPERIDINE HCL 50 MG/ML IJ SOLN
INTRAMUSCULAR | Status: DC | PRN
  Administered 2020-02-18 (×2): 25 mg via INTRAVENOUS

## 2020-02-18 NOTE — Op Note (Signed)
[facility] Patient Name: Christian Carrillo Procedure Date: 02/18/2020 9:54 AM MRN: [id number] Date of Birth: 1951/03/18 Attending MD: Hildred Laser , MD CSN: [id number] Age: 69 Admit Type: Outpatient Procedure:                Upper GI endoscopy Indications:              Esophageal dysphagia Providers:                Hildred Laser, MD, Jeanann Lewandowsky. Sharon Seller, RN, Raphael Gibney, Technician Referring MD:             Fuller Canada Manuella Ghazi, MD Medicines:                Lidocaine spray, Meperidine 50 mg IV, Midazolam 7                            mg IV Complications:            No immediate complications. Estimated Blood Loss:     Estimated blood loss was minimal. Procedure:                Pre-Anesthesia Assessment:                           - Prior to the procedure, a History and Physical                            was performed, and patient medications and                            allergies were reviewed. The patient's tolerance of                            previous anesthesia was also reviewed. The risks                            and benefits of the procedure and the sedation                            options and risks were discussed with the patient.                            All questions were answered, and informed consent                            was obtained. Prior Anticoagulants: The patient has                            taken no previous anticoagulant or antiplatelet                            agents. ASA Grade Assessment: II - A patient with  mild systemic disease. After reviewing the risks                            and benefits, the patient was deemed in                            satisfactory condition to undergo the procedure.                           After obtaining informed consent, the endoscope was                            passed under direct vision. Throughout the                            procedure, the patient's  blood pressure, pulse, and                            oxygen saturations were monitored continuously. The                            [id number]) scope was introduced through the                            mouth, and advanced to the second part of duodenum.                            The upper GI endoscopy was accomplished without                            difficulty. The patient tolerated the procedure                            well. Scope In: 10:16:21 AM Scope Out: 10:28:09 AM Total Procedure Duration: 0 hours 11 minutes 48 seconds  Findings:      The hypopharynx was normal.      One benign-appearing, intrinsic mild stenosis was found 39 to 41 cm from       the incisors. The stenosis was traversed. The scope was withdrawn.       Dilation was performed with a Maloney dilator with mild resistance at 56       Fr. The dilation site was examined following endoscope reinsertion and       showed mild mucosal disruption and no perforation.      The Z-line was irregular and was found 41 cm from the incisors.      A 2 cm hiatal hernia was present.      Localized mild inflammation characterized by congestion (edema),       erythema and granularity was found in the gastric antrum. Biopsies were       taken with a cold forceps for histology.      The exam of the stomach was otherwise normal.      The duodenal bulb and second portion of the duodenum were normal. Impression:               - Normal hypopharynx.                           -  Benign-appearing esophageal stenosis. Dilated.                           - Z-line irregular, 41 cm from the incisors.                           - 2 cm hiatal hernia.                           - Gastritis. Biopsied.                           - Normal duodenal bulb and second portion of the                            duodenum. Moderate Sedation:      Moderate (conscious) sedation was administered by the endoscopy nurse       and supervised by the  endoscopist. The following parameters were       monitored: oxygen saturation, heart rate, blood pressure, CO2       capnography and response to care. Total physician intraservice time was       17 minutes. Recommendation:           - Patient has a contact number available for                            emergencies. The signs and symptoms of potential                            delayed complications were discussed with the                            patient. Return to normal activities tomorrow.                            Written discharge instructions were provided to the                            patient.                           - Resume previous diet today.                           - Continue present medications.                           - No aspirin, ibuprofen, naproxen, or other                            non-steroidal anti-inflammatory drugs for 3 days.                           - Await pathology results.                           - Repeat upper endoscopy PRN.  Procedure Code(s):        --- Professional ---                           [phone], Esophagogastroduodenoscopy, flexible,                            transoral; with biopsy, single or multiple                           43450, Dilation of esophagus, by unguided sound or                            bougie, single or multiple passes                           G0500, Moderate sedation services provided by the                            same physician or other qualified health care                            professional performing a gastrointestinal                            endoscopic service that sedation supports,                            requiring the presence of an independent trained                            observer to assist in the monitoring of the                            patient's level of consciousness and physiological                            status; initial 15 minutes of intra-service time;                             patient age 81 years or older (additional time may                            be reported with [phone], as appropriate) Diagnosis Code(s):        --- Professional ---                           K22.2, Esophageal obstruction                           K22.8, Other specified diseases of esophagus                           K44.9, Diaphragmatic hernia without obstruction or  gangrene                           K29.70, Gastritis, unspecified, without bleeding                           R13.14, Dysphagia, pharyngoesophageal phase CPT copyright 2019 American Medical Association. All rights reserved. The codes documented in this report are preliminary and upon coder review may  be revised to meet current compliance requirements. Hildred Laser, MD Hildred Laser, MD 02/18/2020 10:39:25 AM This report has been signed electronically. Number of Addenda: 0

## 2020-02-18 NOTE — H&P (Signed)
Christian Carrillo is an 69 y.o. male.   Chief Complaint: Patient is here for esophagogastroduodenoscopy with esophageal dilation. 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  He also notices some discomfort with dysphagia.  He points to suprasternal area site of bolus obstruction.  He was begun on Cardizem but he does not feel any better.  He denies nausea vomiting or rectal bleeding.  He also complains of soreness across lower abdomen since he has gained weight.  He says he has chronic total body pain.  Past Medical History:  Diagnosis Date  . Anxiety   . Arthritis   . Chronic back pain   . COPD (chronic obstructive pulmonary disease) ([place])   . Depression   . GERD (gastroesophageal reflux disease)   . Hepatitis   . Hypertension   . Localized edema   . Other emphysema ([place])   . Shortness of breath dyspnea     Past Surgical History:  Procedure Laterality Date  . BACK SURGERY    . cataract surgery     x 1 eye  . ESOPHAGOGASTRODUODENOSCOPY N/A 05/03/2016   Procedure: ESOPHAGOGASTRODUODENOSCOPY (EGD);  Surgeon: Rogene Houston, MD;  Location: [facility];  Service: Endoscopy;  Laterality: N/A;  1:00 - moved to 6/7 @ 7:30 - Ann notified pt  . rt femur fx     traction  . rt shoulder surgery pinning.       Family History  Problem Relation Age of Onset  . Obesity Mother   . Hypothyroidism Mother   . Diabetes Mellitus II Mother   . Cancer Father   . Heart attack Brother   . Diabetes Mellitus II Brother    Social History:  reports that he has been smoking cigarettes. He has a 15.00 pack-year smoking history. He has never used smokeless tobacco. He reports that he does not drink alcohol or use drugs.  Allergies:  Allergies  Allergen Reactions  . Codeine     itching    Medications Prior to Admission  Medication Sig Dispense Refill  . amitriptyline (ELAVIL) 25 MG tablet Take 25 mg by mouth at  bedtime.    . Aspirin-Acetaminophen-Caffeine (GOODYS EXTRA STRENGTH PO) Take 1 Package by mouth daily as needed (headache and body ache). This is the powder form , no more thatn 6 per day.     . baclofen (LIORESAL) 10 MG tablet Take 5-10 mg by mouth in the morning, at noon, and at bedtime.    . chlorthalidone (HYGROTON) 25 MG tablet Take 0.5 tablets (12.5 mg total) by mouth daily. (Patient taking differently: Take 25 mg by mouth daily. ) 45 tablet 3  . cloNIDine (CATAPRES) 0.1 MG tablet Take 0.1 mg by mouth 2 (two) times daily.    Marland Kitchen diltiazem (CARDIZEM) 60 MG tablet TAKE 1 TABLET BY MOUTH THREE TIMES A DAY BEFORE MEALS (Patient taking differently: Take 60 mg by mouth 3 (three) times daily. ) 270 tablet 3  . FLUoxetine (PROZAC) 20 MG capsule Take 20 mg by mouth daily.   0  . losartan (COZAAR) 100 MG tablet TAKE 1 TABLET(100 MG) BY MOUTH DAILY (Patient taking differently: Take 100 mg by mouth at bedtime. ) 90 tablet 3  . pantoprazole (PROTONIX) 40 MG tablet TAKE 1 TABLET BY MOUTH TWICE A DAY BEFORE MEALS (Patient taking differently: Take 40 mg by mouth daily. ) 180 tablet 3  . pravastatin (PRAVACHOL) 40 MG tablet Take  1 tablet (40 mg total) by mouth daily. Please schedule annual appt for refills. [phone]. 1st attempt. (Patient taking differently: Take 40 mg by mouth at bedtime. ) 30 tablet 0  . tamsulosin (FLOMAX) 0.4 MG CAPS capsule Take 0.4 mg by mouth daily.     . furosemide (LASIX) 20 MG tablet Take 1 tablet (20 mg total) by mouth daily as needed. (Patient not taking: Reported on 02/13/2020) 90 tablet 1    No results found for this or any previous visit (from the past 48 hour(s)). No results found.  Review of Systems  Blood pressure (!) 172/83, pulse 61, temperature 97.9 F (36.6 C), temperature source Oral, resp. rate 16, height 6' (1.829 m), weight 104.3 kg, SpO2 96 %. Physical Exam  Constitutional: He appears well-developed and well-nourished.  HENT:  Mouth/Throat: Oropharynx is  clear and moist.  Eyes:  He has prostatic left eye.  Neck: No thyromegaly present.  Cardiovascular: Normal rate, regular rhythm and normal heart sounds.  No murmur heard. Respiratory: Effort normal and breath sounds normal.  GI:  Abdomen is full.  On palpation is soft.  He has mild tenderness in hypogastric region.  No organomegaly or masses.  Musculoskeletal:        General: No edema.  Lymphadenopathy:    He has no cervical adenopathy.  Neurological: He is alert.  Skin: Skin is warm and dry.     Assessment/Plan Esophageal dysphagia in patient with chronic GERD. History of nutcracker esophagus. Esophagogastroduodenoscopy with esophageal dilation.  Hildred Laser, MD 02/18/2020, 10:06 AM

## 2020-02-18 NOTE — Discharge Instructions (Signed)
No aspirin or NSAIDs for 3 days. Resume scheduled medications as before. Resume usual diet. No driving for 24 hours. Physician will call with biopsy results.       Upper Endoscopy, Adult, Care After This sheet gives you information about how to care for yourself after your procedure. Your health care provider may also give you more specific instructions. If you have problems or questions, contact your health care provider. What can I expect after the procedure? After the procedure, it is common to have:  A sore throat.  Mild stomach pain or discomfort.  Bloating.  Nausea. Follow these instructions at home:   Follow instructions from your health care provider about what to eat or drink after your procedure.  Return to your normal activities as told by your health care provider. Ask your health care provider what activities are safe for you.  Take over-the-counter and prescription medicines only as told by your health care provider.  Do not drive for 24 hours if you were given a sedative during your procedure.  Keep all follow-up visits as told by your health care provider. This is important. Contact a health care provider if you have:  A sore throat that lasts longer than one day.  Trouble swallowing. Get help right away if:  You vomit blood or your vomit looks like coffee grounds.  You have: ? A fever. ? Bloody, black, or tarry stools. ? A severe sore throat or you cannot swallow. ? Difficulty breathing. ? Severe pain in your chest or abdomen. Summary  After the procedure, it is common to have a sore throat, mild stomach discomfort, bloating, and nausea.  Do not drive for 24 hours if you were given a sedative during the procedure.  Follow instructions from your health care provider about what to eat or drink after your procedure.  Return to your normal activities as told by your health care provider. This information is not intended to replace advice given  to you by your health care provider. Make sure you discuss any questions you have with your health care provider. Document Revised: 05/07/2018 Document Reviewed: 04/15/2018 Elsevier Patient Education  [facility].

## 2020-02-19 ENCOUNTER — Other Ambulatory Visit: Payer: Self-pay

## 2020-02-19 LAB — SURGICAL PATHOLOGY

## 2020-02-25 ENCOUNTER — Other Ambulatory Visit: Payer: Self-pay | Admitting: Adult Health

## 2020-04-16 DIAGNOSIS — Z1211 Encounter for screening for malignant neoplasm of colon: Secondary | ICD-10-CM | POA: Diagnosis not present

## 2020-04-16 DIAGNOSIS — Z0000 Encounter for general adult medical examination without abnormal findings: Secondary | ICD-10-CM | POA: Diagnosis not present

## 2020-04-16 DIAGNOSIS — Z7189 Other specified counseling: Secondary | ICD-10-CM | POA: Diagnosis not present

## 2020-04-16 DIAGNOSIS — Z299 Encounter for prophylactic measures, unspecified: Secondary | ICD-10-CM | POA: Diagnosis not present

## 2020-04-28 ENCOUNTER — Ambulatory Visit (INDEPENDENT_AMBULATORY_CARE_PROVIDER_SITE_OTHER): Payer: Medicare Other | Admitting: Gastroenterology

## 2020-05-10 DIAGNOSIS — M1711 Unilateral primary osteoarthritis, right knee: Secondary | ICD-10-CM | POA: Diagnosis not present

## 2020-05-10 DIAGNOSIS — I10 Essential (primary) hypertension: Secondary | ICD-10-CM | POA: Diagnosis not present

## 2020-05-10 DIAGNOSIS — Z299 Encounter for prophylactic measures, unspecified: Secondary | ICD-10-CM | POA: Diagnosis not present

## 2020-05-10 DIAGNOSIS — F17210 Nicotine dependence, cigarettes, uncomplicated: Secondary | ICD-10-CM | POA: Diagnosis not present

## 2020-05-10 DIAGNOSIS — M25561 Pain in right knee: Secondary | ICD-10-CM | POA: Diagnosis not present

## 2020-05-14 DIAGNOSIS — E7800 Pure hypercholesterolemia, unspecified: Secondary | ICD-10-CM | POA: Diagnosis not present

## 2020-05-14 DIAGNOSIS — M1711 Unilateral primary osteoarthritis, right knee: Secondary | ICD-10-CM | POA: Diagnosis not present

## 2020-05-14 DIAGNOSIS — Z299 Encounter for prophylactic measures, unspecified: Secondary | ICD-10-CM | POA: Diagnosis not present

## 2020-05-14 DIAGNOSIS — I10 Essential (primary) hypertension: Secondary | ICD-10-CM | POA: Diagnosis not present

## 2020-05-14 DIAGNOSIS — E119 Type 2 diabetes mellitus without complications: Secondary | ICD-10-CM | POA: Diagnosis not present

## 2020-05-27 DIAGNOSIS — I10 Essential (primary) hypertension: Secondary | ICD-10-CM | POA: Diagnosis not present

## 2020-05-27 DIAGNOSIS — Z299 Encounter for prophylactic measures, unspecified: Secondary | ICD-10-CM | POA: Diagnosis not present

## 2020-05-27 DIAGNOSIS — M1990 Unspecified osteoarthritis, unspecified site: Secondary | ICD-10-CM | POA: Diagnosis not present

## 2020-05-27 DIAGNOSIS — E1165 Type 2 diabetes mellitus with hyperglycemia: Secondary | ICD-10-CM | POA: Diagnosis not present

## 2020-06-25 DIAGNOSIS — E1165 Type 2 diabetes mellitus with hyperglycemia: Secondary | ICD-10-CM | POA: Diagnosis not present

## 2020-07-21 DIAGNOSIS — R6889 Other general symptoms and signs: Secondary | ICD-10-CM | POA: Diagnosis not present

## 2020-07-27 DIAGNOSIS — E1165 Type 2 diabetes mellitus with hyperglycemia: Secondary | ICD-10-CM | POA: Diagnosis not present

## 2020-08-18 DIAGNOSIS — J449 Chronic obstructive pulmonary disease, unspecified: Secondary | ICD-10-CM | POA: Diagnosis not present

## 2020-08-18 DIAGNOSIS — Z299 Encounter for prophylactic measures, unspecified: Secondary | ICD-10-CM | POA: Diagnosis not present

## 2020-08-18 DIAGNOSIS — F17210 Nicotine dependence, cigarettes, uncomplicated: Secondary | ICD-10-CM | POA: Diagnosis not present

## 2020-08-18 DIAGNOSIS — I10 Essential (primary) hypertension: Secondary | ICD-10-CM | POA: Diagnosis not present

## 2020-08-18 DIAGNOSIS — E1165 Type 2 diabetes mellitus with hyperglycemia: Secondary | ICD-10-CM | POA: Diagnosis not present

## 2020-08-18 DIAGNOSIS — J441 Chronic obstructive pulmonary disease with (acute) exacerbation: Secondary | ICD-10-CM | POA: Diagnosis not present

## 2020-08-26 DIAGNOSIS — E1165 Type 2 diabetes mellitus with hyperglycemia: Secondary | ICD-10-CM | POA: Diagnosis not present

## 2020-09-20 DIAGNOSIS — F17210 Nicotine dependence, cigarettes, uncomplicated: Secondary | ICD-10-CM | POA: Diagnosis not present

## 2020-09-20 DIAGNOSIS — I10 Essential (primary) hypertension: Secondary | ICD-10-CM | POA: Diagnosis not present

## 2020-09-20 DIAGNOSIS — Z23 Encounter for immunization: Secondary | ICD-10-CM | POA: Diagnosis not present

## 2020-09-20 DIAGNOSIS — E1165 Type 2 diabetes mellitus with hyperglycemia: Secondary | ICD-10-CM | POA: Diagnosis not present

## 2020-09-20 DIAGNOSIS — J449 Chronic obstructive pulmonary disease, unspecified: Secondary | ICD-10-CM | POA: Diagnosis not present

## 2020-09-20 DIAGNOSIS — R7989 Other specified abnormal findings of blood chemistry: Secondary | ICD-10-CM | POA: Diagnosis not present

## 2020-09-20 DIAGNOSIS — Z299 Encounter for prophylactic measures, unspecified: Secondary | ICD-10-CM | POA: Diagnosis not present

## 2020-09-25 DIAGNOSIS — E1165 Type 2 diabetes mellitus with hyperglycemia: Secondary | ICD-10-CM | POA: Diagnosis not present

## 2020-10-04 DIAGNOSIS — F17210 Nicotine dependence, cigarettes, uncomplicated: Secondary | ICD-10-CM | POA: Diagnosis not present

## 2020-10-04 DIAGNOSIS — I10 Essential (primary) hypertension: Secondary | ICD-10-CM | POA: Diagnosis not present

## 2020-10-04 DIAGNOSIS — Z20822 Contact with and (suspected) exposure to covid-19: Secondary | ICD-10-CM | POA: Diagnosis not present

## 2020-10-04 DIAGNOSIS — E7800 Pure hypercholesterolemia, unspecified: Secondary | ICD-10-CM | POA: Diagnosis not present

## 2020-10-04 DIAGNOSIS — J449 Chronic obstructive pulmonary disease, unspecified: Secondary | ICD-10-CM | POA: Diagnosis not present

## 2020-10-04 DIAGNOSIS — Z299 Encounter for prophylactic measures, unspecified: Secondary | ICD-10-CM | POA: Diagnosis not present

## 2020-10-26 DIAGNOSIS — J449 Chronic obstructive pulmonary disease, unspecified: Secondary | ICD-10-CM | POA: Diagnosis not present

## 2020-10-26 DIAGNOSIS — J069 Acute upper respiratory infection, unspecified: Secondary | ICD-10-CM | POA: Diagnosis not present

## 2020-10-26 DIAGNOSIS — F17210 Nicotine dependence, cigarettes, uncomplicated: Secondary | ICD-10-CM | POA: Diagnosis not present

## 2020-10-26 DIAGNOSIS — I5030 Unspecified diastolic (congestive) heart failure: Secondary | ICD-10-CM | POA: Diagnosis not present

## 2020-10-26 DIAGNOSIS — R059 Cough, unspecified: Secondary | ICD-10-CM | POA: Diagnosis not present

## 2020-10-26 DIAGNOSIS — Z299 Encounter for prophylactic measures, unspecified: Secondary | ICD-10-CM | POA: Diagnosis not present

## 2020-10-26 DIAGNOSIS — E1165 Type 2 diabetes mellitus with hyperglycemia: Secondary | ICD-10-CM | POA: Diagnosis not present

## 2020-10-27 DIAGNOSIS — R6889 Other general symptoms and signs: Secondary | ICD-10-CM | POA: Diagnosis not present

## 2020-11-25 DIAGNOSIS — Z961 Presence of intraocular lens: Secondary | ICD-10-CM | POA: Diagnosis not present

## 2020-11-25 DIAGNOSIS — E1165 Type 2 diabetes mellitus with hyperglycemia: Secondary | ICD-10-CM | POA: Diagnosis not present

## 2020-11-25 DIAGNOSIS — H524 Presbyopia: Secondary | ICD-10-CM | POA: Diagnosis not present

## 2020-12-06 DIAGNOSIS — E1165 Type 2 diabetes mellitus with hyperglycemia: Secondary | ICD-10-CM | POA: Diagnosis not present

## 2020-12-06 DIAGNOSIS — F17210 Nicotine dependence, cigarettes, uncomplicated: Secondary | ICD-10-CM | POA: Diagnosis not present

## 2020-12-06 DIAGNOSIS — I5030 Unspecified diastolic (congestive) heart failure: Secondary | ICD-10-CM | POA: Diagnosis not present

## 2020-12-06 DIAGNOSIS — I10 Essential (primary) hypertension: Secondary | ICD-10-CM | POA: Diagnosis not present

## 2020-12-06 DIAGNOSIS — J449 Chronic obstructive pulmonary disease, unspecified: Secondary | ICD-10-CM | POA: Diagnosis not present

## 2020-12-06 DIAGNOSIS — Z299 Encounter for prophylactic measures, unspecified: Secondary | ICD-10-CM | POA: Diagnosis not present

## 2020-12-27 DIAGNOSIS — E1165 Type 2 diabetes mellitus with hyperglycemia: Secondary | ICD-10-CM | POA: Diagnosis not present

## 2021-01-24 DIAGNOSIS — E1165 Type 2 diabetes mellitus with hyperglycemia: Secondary | ICD-10-CM | POA: Diagnosis not present

## 2021-02-20 IMAGING — RF DG ESOPHAGUS
10 series · 15 of 24 positions shown · non-contrast
Comparison: 04/13/2016

CLINICAL DATA: Dysphagia, prior history of esophageal dilatation,
spasms

EXAM:
ESOPHOGRAM / BARIUM SWALLOW / BARIUM TABLET STUDY
TECHNIQUE: Combined double contrast and single contrast examination performed
using effervescent crystals, thick barium liquid, and thin barium
liquid. The patient was observed with fluoroscopy swallowing a 13 mm
barium sulphate tablet.
FLUOROSCOPY TIME:  Fluoroscopy Time:  2 minutes 0 seconds
Radiation Exposure Index (if provided by the fluoroscopic device):
52.6 mGy
Number of Acquired Spot Images: multiple fluoroscopic screen
captures

[Series 1: cp_standard · 0.17mm/px · 2 of 98 frames shown (1 of 10)]
[frame 15/98]
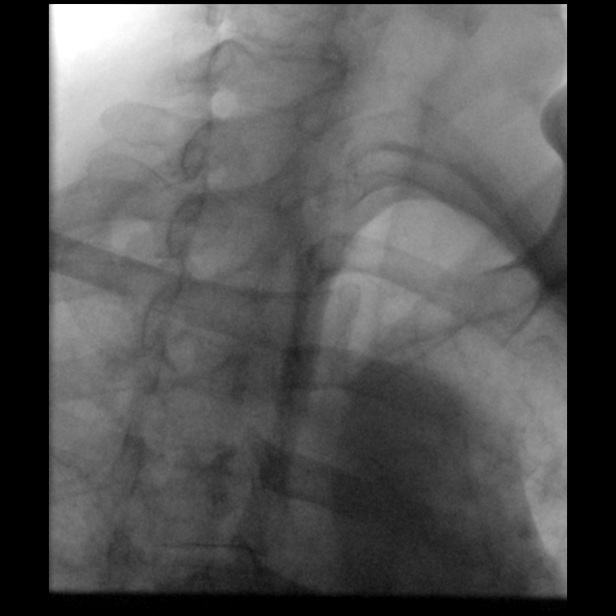
[frame 98/98  full-range]
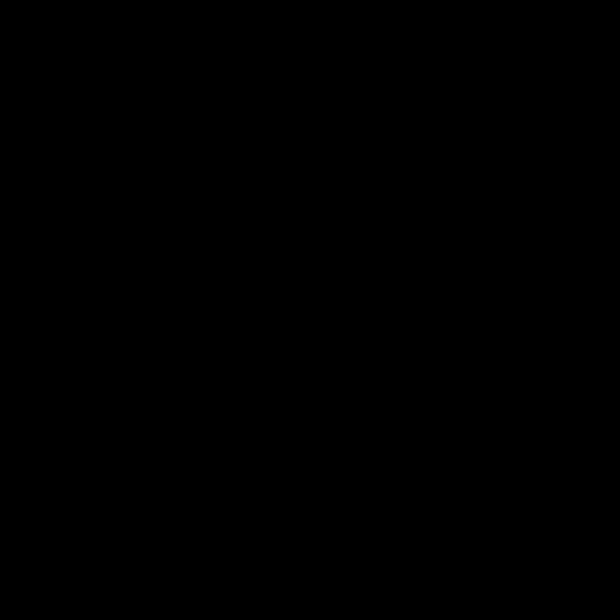

[Series 2: cp_standard · 0.18mm/px · 1 of 102 frames shown (2 of 10)]
[frame 87/102]
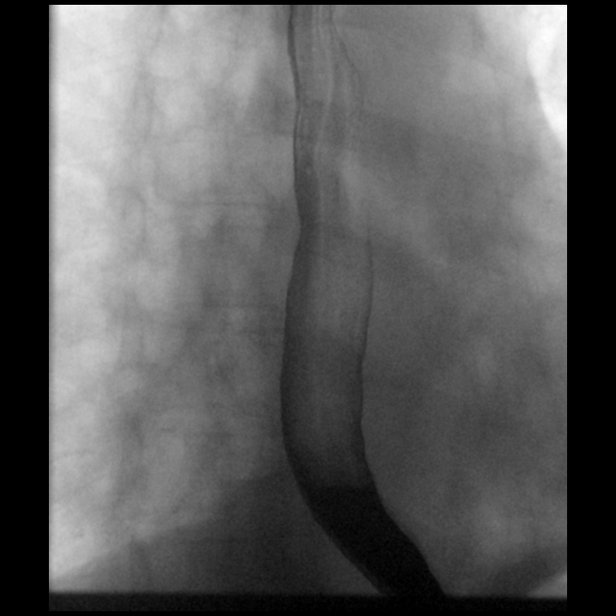

[Series 3: cp_standard · 0.17mm/px · 1 of 106 frames shown (3 of 10)]
[frame 16/106]
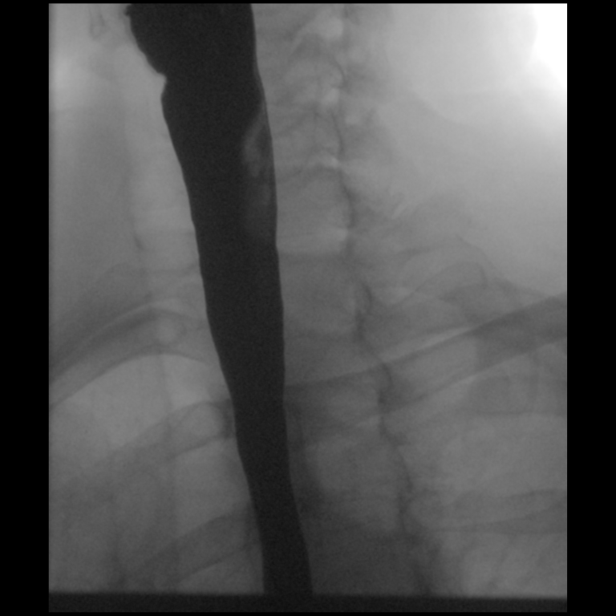

[Series 4: cp_standard · 0.27mm/px · 2 of 117 frames shown (4 of 10)]
[frame 18/117]
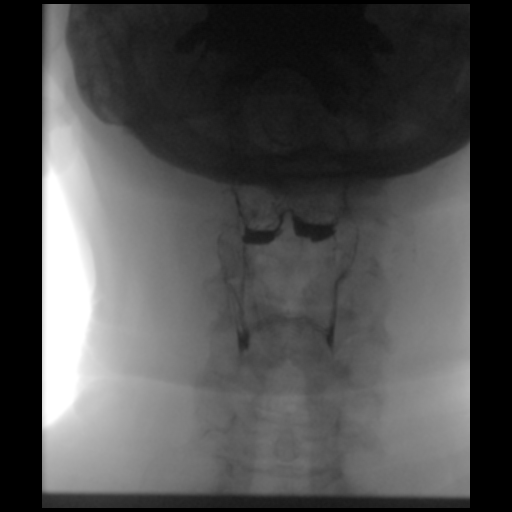
[frame 100/117]
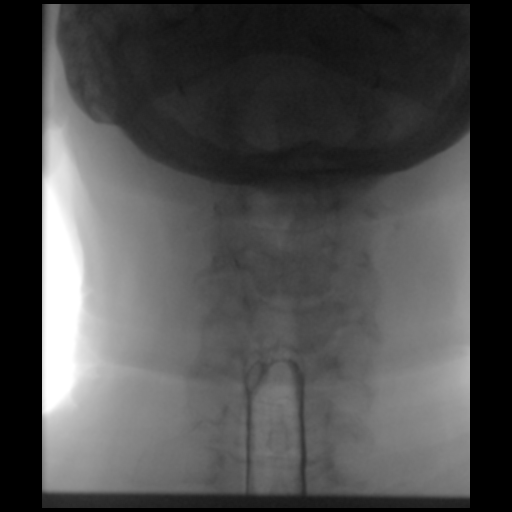

[Series 5: cp_standard · 0.26mm/px · 1 of 204 frames shown (5 of 10)]
[frame 31/204]
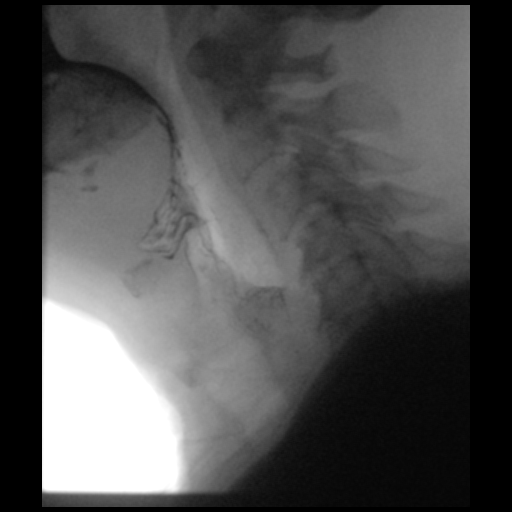

[Series 6: cp_standard · 0.17mm/px · 2 of 13 frames shown (6 of 10)]
[frame 2/13]
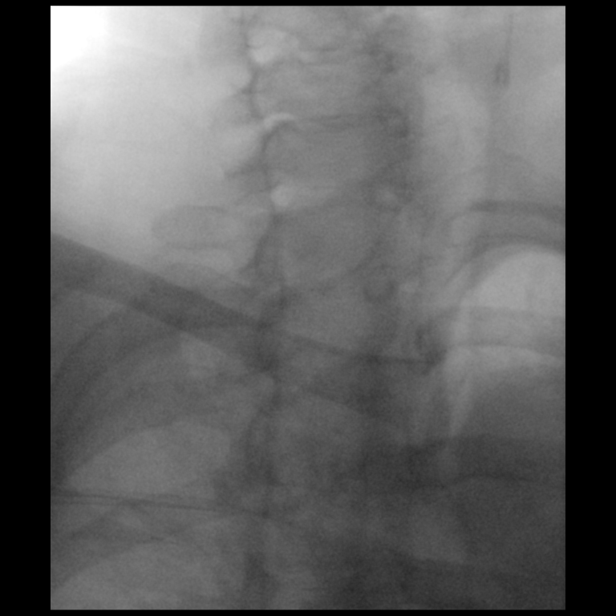
[frame 7/13]
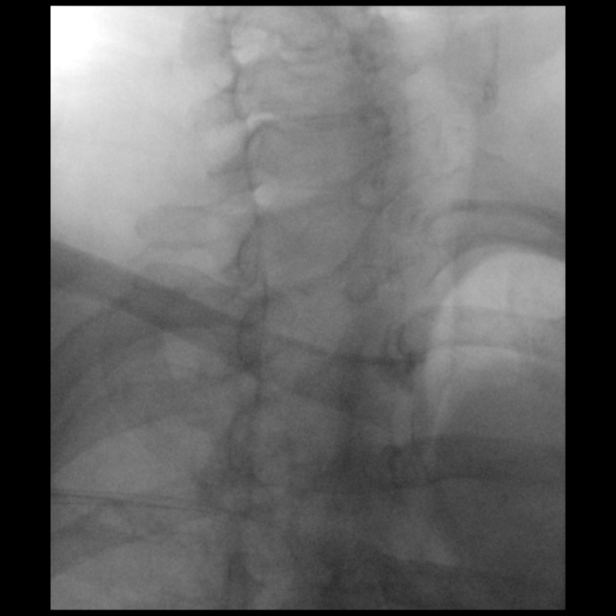

[Series 7: cp_standard · 0.17mm/px · 2 of 93 frames shown (7 of 10)]
[frame 33/93]
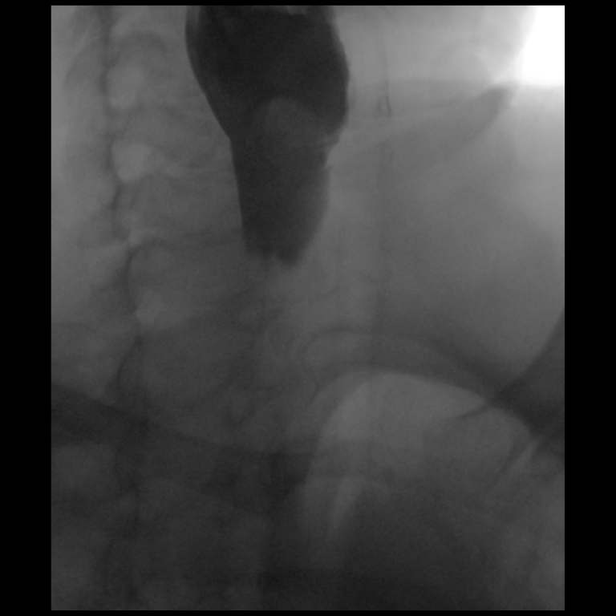
[frame 80/93]
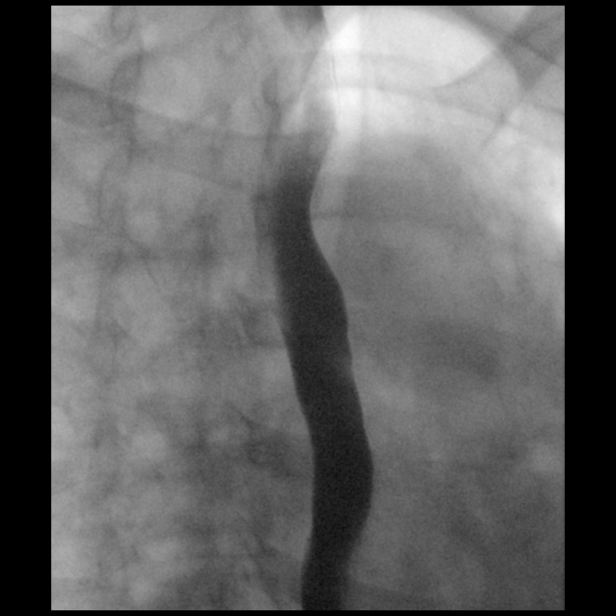

[Series 8: cp_standard · 0.17mm/px · 1 of 29 frames shown (8 of 10)]
[frame 25/29]
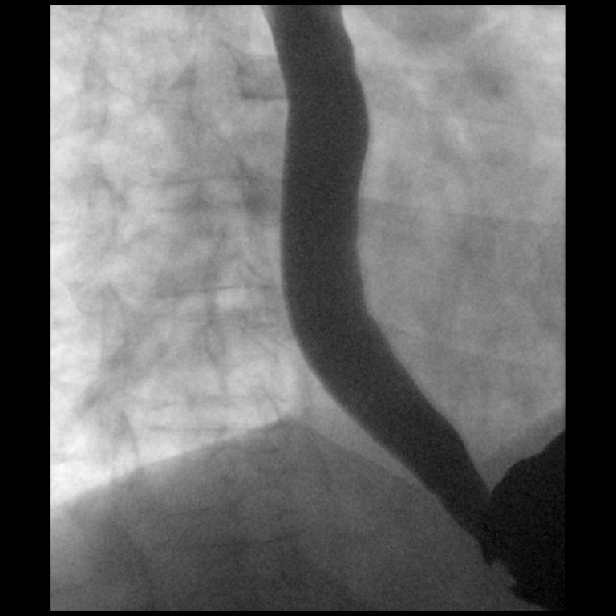

[Series 9: cp_standard · 0.17mm/px · 1 of 136 frames shown (9 of 10)]
[frame 110/136]
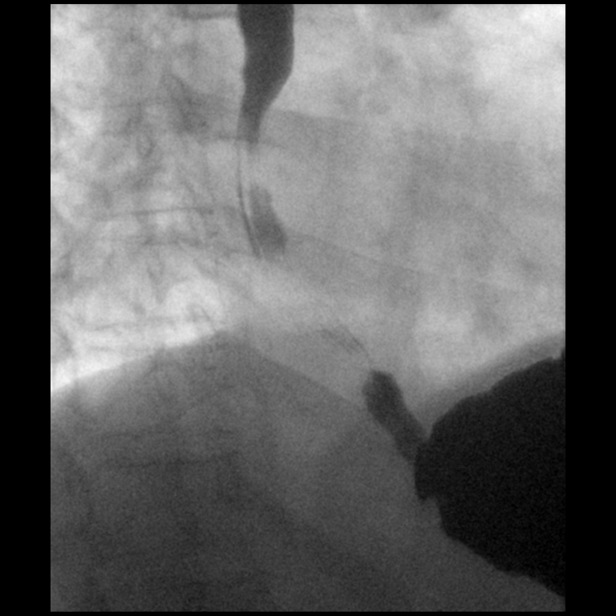

[Series 10: cp_standard · 0.18mm/px · 2 of 151 frames shown (10 of 10)]
[frame 23/151]
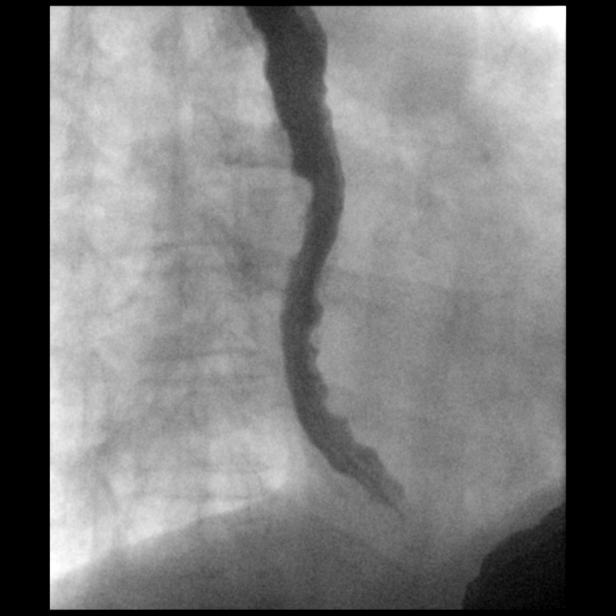
[frame 149/151  full-range]
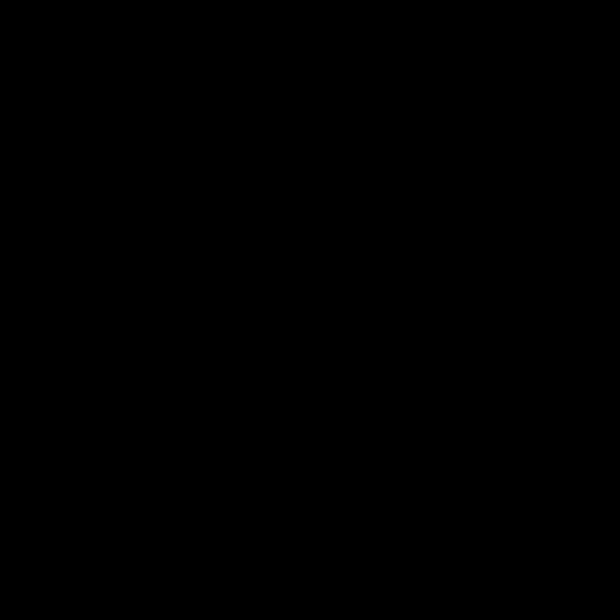

[15 of 24 positions shown; findings below may reference images not displayed]

FINDINGS: Esophageal distention: Normal distention without mass or stricture

Filling defects:  None

12.5 mm barium tablet: Easily passed from oral cavity to stomach
without obstruction

Motility: Mild age-related esophageal dysmotility with incomplete
clearance of barium by primary peristaltic waves. Prolonged thoracic
retention of contrast.

Mucosa: Subtle area of mucosal irregularity identified along the
RIGHT anterolateral wall of the esophagus at approximately the level
of the carina. Remaining mucosa appears smooth and regular.

Hypopharynx/cervical esophagus: Laryngeal penetration without
aspiration. No significant residuals.

Hiatal hernia:  Absent

GE reflux:  Not witnessed during exam

Other:  N/A
IMPRESSION: Laryngeal penetration without aspiration.

Subtle area of mucosal irregularity at the RIGHT anterolateral
aspect of the esophagus at the level of the carina, could represent
esophagitis but cannot completely exclude developing tumor;
endoscopic evaluation recommended.

## 2021-02-24 DIAGNOSIS — E1165 Type 2 diabetes mellitus with hyperglycemia: Secondary | ICD-10-CM | POA: Diagnosis not present

## 2021-03-09 DIAGNOSIS — I10 Essential (primary) hypertension: Secondary | ICD-10-CM | POA: Diagnosis not present

## 2021-03-09 DIAGNOSIS — M1990 Unspecified osteoarthritis, unspecified site: Secondary | ICD-10-CM | POA: Diagnosis not present

## 2021-03-09 DIAGNOSIS — E1165 Type 2 diabetes mellitus with hyperglycemia: Secondary | ICD-10-CM | POA: Diagnosis not present

## 2021-03-09 DIAGNOSIS — Z299 Encounter for prophylactic measures, unspecified: Secondary | ICD-10-CM | POA: Diagnosis not present

## 2021-03-25 DIAGNOSIS — E1165 Type 2 diabetes mellitus with hyperglycemia: Secondary | ICD-10-CM | POA: Diagnosis not present

## 2021-03-28 ENCOUNTER — Other Ambulatory Visit (INDEPENDENT_AMBULATORY_CARE_PROVIDER_SITE_OTHER): Payer: Self-pay

## 2021-03-28 ENCOUNTER — Encounter (INDEPENDENT_AMBULATORY_CARE_PROVIDER_SITE_OTHER): Payer: Self-pay | Admitting: Gastroenterology

## 2021-03-28 ENCOUNTER — Telehealth (INDEPENDENT_AMBULATORY_CARE_PROVIDER_SITE_OTHER): Payer: Self-pay

## 2021-03-28 ENCOUNTER — Other Ambulatory Visit: Payer: Self-pay

## 2021-03-28 ENCOUNTER — Encounter (INDEPENDENT_AMBULATORY_CARE_PROVIDER_SITE_OTHER): Payer: Self-pay

## 2021-03-28 ENCOUNTER — Ambulatory Visit (INDEPENDENT_AMBULATORY_CARE_PROVIDER_SITE_OTHER): Payer: Medicare Other | Admitting: Gastroenterology

## 2021-03-28 VITALS — BP 165/81 | HR 59 | Temp 97.9°F | Ht 73.0 in | Wt 221.0 lb

## 2021-03-28 DIAGNOSIS — K224 Dyskinesia of esophagus: Secondary | ICD-10-CM

## 2021-03-28 DIAGNOSIS — K219 Gastro-esophageal reflux disease without esophagitis: Secondary | ICD-10-CM

## 2021-03-28 DIAGNOSIS — R12 Heartburn: Secondary | ICD-10-CM | POA: Diagnosis not present

## 2021-03-28 MED ORDER — DILTIAZEM HCL 60 MG PO TABS: 60.0000 mg | ORAL_TABLET | Freq: Three times a day (TID) | ORAL | 3 refills | Status: DC | PRN

## 2021-03-28 MED ORDER — PANTOPRAZOLE SODIUM 40 MG PO TBEC: 40.0000 mg | DELAYED_RELEASE_TABLET | Freq: Two times a day (BID) | ORAL | 3 refills | Status: DC

## 2021-03-28 MED ORDER — PEG 3350-KCL-NA BICARB-NACL 420 G PO SOLR: 4000.0000 mL | ORAL | 0 refills | Status: DC

## 2021-03-28 NOTE — Telephone Encounter (Signed)
Leighann Lakeva Hollon, CMA 

## 2021-03-28 NOTE — Patient Instructions (Addendum)
Referral to [facility] for pH/impedance testing and esophageal manometry Schedule colonoscopy Continue pantoprazole 40 mg twice a day Explained presumed etiology of reflux symptoms. Instruction provided in the use of antireflux medication - patient should take medication in the morning 30-45 minutes before eating breakfast and supper. Discussed avoidance of eating within 2 hours of lying down to sleep and benefit of blocks to elevate head of bed.

## 2021-03-28 NOTE — Progress Notes (Signed)
Maylon Peppers, M.D. Gastroenterology & Hepatology [facility] [address] [place], [place] [address]  Primary Care Physician: Monico Blitz, [place] [address]  I will communicate my assessment and recommendations to the referring MD via EMR.  Problems: 1. GERD 2. Jackhammer esophagus  History of Present Illness: Christian Carrillo is a 70 y.o. male with medical history of COPD, depression, GERD, hypertension and jackhammer esophagus, who presents for follow up of heartburn.  The patient was last seen on 01/26/2020. At that time, the patient was sent the refill for diltiazem 60 mg before each meal for management of jackhammer esophagus.  He underwent an esophagram on 01/30/2020 which showed area of irregularity at the right anterolateral aspect of the esophagus at the level of the carina.  Due to this he was scheduled to undergo an EGD, which he had performed on 02/18/2020.  EGD showed benign stenosis between 39 and 41 cm from the incisors, a dilation was performed with Maloney up to 75 [place] with mild mucosal disruption.  Patient will have hernia was present, there is also presence of gastritis in the antrum (biopsies were negative for H. pylori), normal duodenum.  Patient reports that every 2 days he presents episodes of heartburn, despite taking the pantoprazole 40 mg BID. In the AM he takes it before breakfast, waits 2 hours to have breakfast, at night he takes it at 10 PM before going to sleep.   Due to this he has to take peptobismol, which provides some relief transiently. He reports that he feels a recurrent discomfort in his mid chest, which he describes as a burning sensation.  He denies having any exertional chest pain.  Wants to lose weight but he can't exercise for a long period of time. Has decreased his weight intentionally by changing his food intake, reports he lost 10 lb.  The patient denies having any nausea,  vomiting, fever, chills, odynophagia, hematochezia, melena, hematemesis, abdominal distention, abdominal pain, diarrhea, jaundice, pruritus.  Last Colonoscopy: 2012 - normal  Past Medical History: Past Medical History:  Diagnosis Date  . Anxiety   . Arthritis   . Chronic back pain   . COPD (chronic obstructive pulmonary disease) ([place])   . Depression   . GERD (gastroesophageal reflux disease)   . Hepatitis   . Hypertension   . Localized edema   . Other emphysema ([place])   . Shortness of breath dyspnea     Past Surgical History: Past Surgical History:  Procedure Laterality Date  . BACK SURGERY    . BIOPSY  02/18/2020   Procedure: BIOPSY;  Surgeon: Rogene Houston, MD;  [facility];  Service: Endoscopy;;  gastric  . cataract surgery     x 1 eye  . ESOPHAGEAL DILATION N/A 02/18/2020   Procedure: ESOPHAGEAL DILATION;  Surgeon: Rogene Houston, MD;  [facility];  Service: Endoscopy;  Laterality: N/A;  . ESOPHAGOGASTRODUODENOSCOPY N/A 05/03/2016   Procedure: ESOPHAGOGASTRODUODENOSCOPY (EGD);  Surgeon: Rogene Houston, MD;  [facility];  Service: Endoscopy;  Laterality: N/A;  1:00 - moved to 6/7 @ 7:30 - Ann notified pt  . ESOPHAGOGASTRODUODENOSCOPY N/A 02/18/2020   Procedure: ESOPHAGOGASTRODUODENOSCOPY (EGD);  Surgeon: Rogene Houston, MD;  [facility];  Service: Endoscopy;  Laterality: N/A;  240  . rt femur fx     traction  . rt shoulder surgery pinning.       Family History: Family History  Problem Relation Age of Onset  . Obesity Mother   . Hypothyroidism Mother   . Diabetes Mellitus II Mother   . Cancer Father   . Heart attack Brother   . Diabetes Mellitus II Brother     Social History: Social History   Tobacco Use  Smoking Status Current Every Day Smoker  . Packs/day: 1.00  . Years: 30.00  . Pack years: 30.00  . Types: Cigarettes  . Last attempt to quit: 03/06/2016  . Years since quitting: 5.0  Smokeless Tobacco  Never Used   Social History   Substance and Sexual Activity  Alcohol Use No  . Alcohol/week: 0.0 standard drinks   Social History   Substance and Sexual Activity  Drug Use No    Allergies: Allergies  Allergen Reactions  . Codeine     itching    Medications: Current Outpatient Medications  Medication Sig Dispense Refill  . amitriptyline (ELAVIL) 25 MG tablet Take 25 mg by mouth at bedtime.    . Aspirin-Acetaminophen-Caffeine (GOODYS EXTRA STRENGTH PO) Take 1 Package by mouth daily as needed (headache and body ache). This is the powder form , no more thatn 6 per day.    . chlorthalidone (HYGROTON) 25 MG tablet Take 0.5 tablets (12.5 mg total) by mouth daily. (Patient taking differently: Take 25 mg by mouth daily.) 45 tablet 3  . cloNIDine (CATAPRES) 0.1 MG tablet Take 0.1 mg by mouth 2 (two) times daily.    Marland Kitchen diltiazem (CARDIZEM) 60 MG tablet TAKE 1 TABLET BY MOUTH THREE TIMES A DAY BEFORE MEALS (Patient taking differently: Take 60 mg by mouth 3 (three) times daily.) 270 tablet 3  . FLUoxetine (PROZAC) 20 MG capsule Take 20 mg by mouth daily.   0  . losartan (COZAAR) 100 MG tablet TAKE 1 TABLET(100 MG) BY MOUTH DAILY (Patient taking differently: Take 100 mg by mouth at bedtime.) 90 tablet 3  . pantoprazole (PROTONIX) 40 MG tablet TAKE 1 TABLET BY MOUTH TWICE A DAY BEFORE MEALS (Patient taking differently: Take 40 mg by mouth 2 (two) times daily.) 180 tablet 3  . pravastatin (PRAVACHOL) 40 MG tablet Take 1 tablet (40 mg total) by mouth daily. Please schedule annual appt for refills. [id number]. 1st attempt. (Patient taking differently: Take 40 mg by mouth at bedtime.) 30 tablet 0  . tamsulosin (FLOMAX) 0.4 MG CAPS capsule Take 0.4 mg by mouth daily.      No current facility-administered medications for this visit.    Review of Systems: GENERAL: negative for malaise, night sweats HEENT: No changes in hearing or vision, no nose bleeds or other nasal problems. NECK: Negative for  lumps, goiter, pain and significant neck swelling RESPIRATORY: Negative for cough, wheezing CARDIOVASCULAR: Negative for chest pain, leg swelling, palpitations, orthopnea GI: SEE HPI MUSCULOSKELETAL: Negative for joint pain or swelling, back pain, and muscle pain. SKIN: Negative for lesions, rash PSYCH: Negative for sleep disturbance, mood disorder and recent psychosocial stressors. HEMATOLOGY Negative for prolonged bleeding, bruising easily, and swollen nodes. ENDOCRINE: Negative for cold or heat intolerance, polyuria, polydipsia and goiter. NEURO: negative for tremor, gait imbalance, syncope and seizures. The remainder of the review of systems is noncontributory.   Physical Exam: BP (!) 165/81 (BP Location: Left Arm, Patient Position: Sitting, Cuff Size: Large)   Pulse (!) 59   Temp 97.9 F (36.6 C) (Oral)   Ht 6\' 1"  (1.854 m)   Wt 221 lb (100.2 kg)   BMI 29.16 kg/m  GENERAL: The patient is AO x3,  in no acute distress. HEENT: Head is normocephalic and atraumatic. EOMI are intact. Mouth is well hydrated and without lesions. NECK: Supple. No masses LUNGS: Clear to auscultation. No presence of rhonchi/wheezing/rales. Adequate chest expansion HEART: RRR, normal s1 and s2. ABDOMEN: Soft, nontender, no guarding, no peritoneal signs, and nondistended. BS +. No masses. EXTREMITIES: Without any cyanosis, clubbing, rash, lesions or edema. NEUROLOGIC: AOx3, no focal motor deficit. SKIN: no jaundice, no rashes  Imaging/Labs: as above  I personally reviewed and interpreted the available labs, imaging and endoscopic files.  Impression and Plan: BRANSON KRANZ is a 71 y.o. male with medical history of COPD, depression, GERD, hypertension and jackhammer esophagus, who presents for follow up of heartburn.  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  He is currently on max dose PPI, I advised him on the best way to take his medication to obtain the highest efficacy of this.  He should continue taking the pantoprazole for now, but we will need to investigate if he has any ongoing reflux episodes with a pH impedance testing.  As he had previous evidence of jackhammer esophagus, we will readdress this with repeat manometry on the same day, he will need to continue taking his diltiazem at the same dose.  Finally, the patient is due for colorectal cancer screening, will schedule a colonoscopy.  - Referral for pH/impedance testing and esophageal manometry on PPI - Schedule colonoscopy - Continue pantoprazole 40 mg twice a day - Explained presumed etiology of reflux symptoms. Instruction provided in the use of antireflux medication - patient should take medication in the morning 30-45 minutes before eating breakfast and supper. Discussed avoidance of eating within 2 hours of lying down to sleep and benefit of blocks to elevate head of bed.  All questions were answered.      Christian Quale, MD Gastroenterology and Hepatology [facility]

## 2021-04-04 DIAGNOSIS — R52 Pain, unspecified: Secondary | ICD-10-CM | POA: Diagnosis not present

## 2021-04-04 DIAGNOSIS — E1165 Type 2 diabetes mellitus with hyperglycemia: Secondary | ICD-10-CM | POA: Diagnosis not present

## 2021-04-04 DIAGNOSIS — M1990 Unspecified osteoarthritis, unspecified site: Secondary | ICD-10-CM | POA: Diagnosis not present

## 2021-04-04 DIAGNOSIS — I739 Peripheral vascular disease, unspecified: Secondary | ICD-10-CM | POA: Diagnosis not present

## 2021-04-04 DIAGNOSIS — I10 Essential (primary) hypertension: Secondary | ICD-10-CM | POA: Diagnosis not present

## 2021-04-04 DIAGNOSIS — R7989 Other specified abnormal findings of blood chemistry: Secondary | ICD-10-CM | POA: Diagnosis not present

## 2021-04-04 DIAGNOSIS — Z299 Encounter for prophylactic measures, unspecified: Secondary | ICD-10-CM | POA: Diagnosis not present

## 2021-04-11 ENCOUNTER — Other Ambulatory Visit (INDEPENDENT_AMBULATORY_CARE_PROVIDER_SITE_OTHER): Payer: Self-pay

## 2021-04-11 DIAGNOSIS — R7989 Other specified abnormal findings of blood chemistry: Secondary | ICD-10-CM | POA: Diagnosis not present

## 2021-04-11 DIAGNOSIS — I739 Peripheral vascular disease, unspecified: Secondary | ICD-10-CM | POA: Diagnosis not present

## 2021-04-18 ENCOUNTER — Other Ambulatory Visit: Payer: Self-pay

## 2021-04-18 ENCOUNTER — Other Ambulatory Visit (INDEPENDENT_AMBULATORY_CARE_PROVIDER_SITE_OTHER): Payer: Self-pay | Admitting: Gastroenterology

## 2021-04-18 ENCOUNTER — Other Ambulatory Visit (HOSPITAL_COMMUNITY)
Admission: RE | Admit: 2021-04-18 | Discharge: 2021-04-18 | Disposition: A | Discharge status: Home / Self Care | Payer: Medicare Other | Source: Ambulatory Visit | Attending: Gastroenterology | Admitting: Gastroenterology

## 2021-04-18 DIAGNOSIS — Z20822 Contact with and (suspected) exposure to covid-19: Secondary | ICD-10-CM | POA: Diagnosis not present

## 2021-04-18 DIAGNOSIS — Z01812 Encounter for preprocedural laboratory examination: Secondary | ICD-10-CM | POA: Diagnosis not present

## 2021-04-18 LAB — BASIC METABOLIC PANEL
Anion gap: 8 (ref 5–15)
BUN: 8 mg/dL (ref 8–23)
CO2: 28 mmol/L (ref 22–32)
Calcium: 8.2 mg/dL — ABNORMAL LOW (ref 8.9–10.3)
Chloride: 103 mmol/L (ref 98–111)
Creatinine, Ser: 0.96 mg/dL (ref 0.61–1.24)
GFR, Estimated: >60 mL/min (ref >60)
Glucose, Bld: 123 mg/dL — ABNORMAL HIGH (ref 70–99)
Potassium: 3.2 mmol/L — ABNORMAL LOW (ref 3.5–5.1)
Sodium: 139 mmol/L (ref 135–145)

## 2021-04-18 LAB — SARS CORONAVIRUS 2 (TAT 6-24 HRS): SARS Coronavirus 2: NEGATIVE

## 2021-04-18 MED ORDER — POTASSIUM CHLORIDE CRYS ER 20 MEQ PO TBCR: 60.0000 meq | EXTENDED_RELEASE_TABLET | Freq: Once | ORAL | 0 refills | Status: DC

## 2021-04-19 ENCOUNTER — Ambulatory Visit (HOSPITAL_COMMUNITY): Admission: RE | Admit: 2021-04-19 | Payer: Medicare Other | Source: Ambulatory Visit | Admitting: Gastroenterology

## 2021-04-19 SURGERY — COLONOSCOPY WITH PROPOFOL
Anesthesia: Monitor Anesthesia Care

## 2021-04-26 DIAGNOSIS — E1165 Type 2 diabetes mellitus with hyperglycemia: Secondary | ICD-10-CM | POA: Diagnosis not present

## 2021-05-10 DIAGNOSIS — Z8616 Personal history of COVID-19: Secondary | ICD-10-CM | POA: Diagnosis not present

## 2021-05-10 DIAGNOSIS — Z299 Encounter for prophylactic measures, unspecified: Secondary | ICD-10-CM | POA: Diagnosis not present

## 2021-05-10 DIAGNOSIS — F17210 Nicotine dependence, cigarettes, uncomplicated: Secondary | ICD-10-CM | POA: Diagnosis not present

## 2021-05-10 DIAGNOSIS — J449 Chronic obstructive pulmonary disease, unspecified: Secondary | ICD-10-CM | POA: Diagnosis not present

## 2021-05-10 DIAGNOSIS — I5030 Unspecified diastolic (congestive) heart failure: Secondary | ICD-10-CM | POA: Diagnosis not present

## 2021-05-26 DIAGNOSIS — E1165 Type 2 diabetes mellitus with hyperglycemia: Secondary | ICD-10-CM | POA: Diagnosis not present

## 2021-06-08 DIAGNOSIS — E1165 Type 2 diabetes mellitus with hyperglycemia: Secondary | ICD-10-CM | POA: Diagnosis not present

## 2021-06-08 DIAGNOSIS — Z299 Encounter for prophylactic measures, unspecified: Secondary | ICD-10-CM | POA: Diagnosis not present

## 2021-06-08 DIAGNOSIS — D692 Other nonthrombocytopenic purpura: Secondary | ICD-10-CM | POA: Diagnosis not present

## 2021-06-08 DIAGNOSIS — I10 Essential (primary) hypertension: Secondary | ICD-10-CM | POA: Diagnosis not present

## 2021-06-08 DIAGNOSIS — R079 Chest pain, unspecified: Secondary | ICD-10-CM | POA: Diagnosis not present

## 2021-06-24 DIAGNOSIS — E1165 Type 2 diabetes mellitus with hyperglycemia: Secondary | ICD-10-CM | POA: Diagnosis not present

## 2021-06-26 DIAGNOSIS — E1165 Type 2 diabetes mellitus with hyperglycemia: Secondary | ICD-10-CM | POA: Diagnosis not present

## 2021-06-29 DIAGNOSIS — K449 Diaphragmatic hernia without obstruction or gangrene: Secondary | ICD-10-CM | POA: Diagnosis not present

## 2021-06-29 DIAGNOSIS — K219 Gastro-esophageal reflux disease without esophagitis: Secondary | ICD-10-CM | POA: Diagnosis not present

## 2021-07-01 DIAGNOSIS — K219 Gastro-esophageal reflux disease without esophagitis: Secondary | ICD-10-CM | POA: Diagnosis not present

## 2021-07-04 ENCOUNTER — Telehealth (INDEPENDENT_AMBULATORY_CARE_PROVIDER_SITE_OTHER): Payer: Self-pay | Admitting: Gastroenterology

## 2021-07-04 DIAGNOSIS — K219 Gastro-esophageal reflux disease without esophagitis: Secondary | ICD-10-CM

## 2021-07-04 MED ORDER — OMEPRAZOLE 40 MG PO CPDR
40.0000 mg | DELAYED_RELEASE_CAPSULE | Freq: Two times a day (BID) | ORAL | 3 refills | Status: DC
Start: 2021-07-04 — End: 2023-02-22

## 2021-07-04 NOTE — Telephone Encounter (Signed)
I received the results of the pH impedance testing and esophageal manometry performed on 06/29/2021.  Esophageal manometry showed presence of a borderline IRP of 15.1, with presence of 2 cm hiatal hernia, there was also presence of normal peristalsis.  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  On the other hand the pH impedance study showed presence of 6% acid exposure while on PPI but there was borderline correlation of the episodes of reflux based on the [place] consensus (3/7at 42.9%) with an association probability of 93.4%.  Nevertheless, the DeMeester score was 27.8.  I had a thorough discussion with the patient and the wife regarding these findings.  I explained that it is possible that part of his symptoms are related to ongoing acid reflux.  The states that he has been on multiple PPIs in the past but they do not recall if he has been on the max dose of these in the past.  Due to this, we will try treating GERD with omeprazole 40 mg twice a day, pantoprazole will be stopped.  I gave recommendations on how to take the medication adequately, at this waiting 40 minutes to have any meal.  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  He had a recent barium esophagram that was inconsistent with EGJOO, may require to have confirmation with FLIP at the tertiary center before considering Nissen fundoplication.  Both the patient and the wife understood and agreed.  Finally, he did not reschedule his colonoscopy in the past. Darius Bump, can you please schedule a screening colonoscopy? Dx: Screening, low risk. Room: 1  Mitzie, can you please schedule a follow up  appointment for this patient in 6 months?  Thanks,  Maylon Peppers, MD Gastroenterology and Hepatology [facility]

## 2021-07-08 DIAGNOSIS — R030 Elevated blood-pressure reading, without diagnosis of hypertension: Secondary | ICD-10-CM | POA: Diagnosis not present

## 2021-07-08 DIAGNOSIS — Z299 Encounter for prophylactic measures, unspecified: Secondary | ICD-10-CM | POA: Diagnosis not present

## 2021-07-08 DIAGNOSIS — M1990 Unspecified osteoarthritis, unspecified site: Secondary | ICD-10-CM | POA: Diagnosis not present

## 2021-07-08 DIAGNOSIS — I10 Essential (primary) hypertension: Secondary | ICD-10-CM | POA: Diagnosis not present

## 2021-07-08 DIAGNOSIS — K219 Gastro-esophageal reflux disease without esophagitis: Secondary | ICD-10-CM | POA: Diagnosis not present

## 2021-07-08 DIAGNOSIS — F17210 Nicotine dependence, cigarettes, uncomplicated: Secondary | ICD-10-CM | POA: Diagnosis not present

## 2021-07-08 DIAGNOSIS — B0229 Other postherpetic nervous system involvement: Secondary | ICD-10-CM | POA: Diagnosis not present

## 2021-07-27 DIAGNOSIS — E1165 Type 2 diabetes mellitus with hyperglycemia: Secondary | ICD-10-CM | POA: Diagnosis not present

## 2021-08-25 NOTE — Telephone Encounter (Signed)
Ok, if he calls back to discuss this, we will discuss setting it up

## 2021-08-26 DIAGNOSIS — E1165 Type 2 diabetes mellitus with hyperglycemia: Secondary | ICD-10-CM | POA: Diagnosis not present

## 2021-09-23 DIAGNOSIS — Z7189 Other specified counseling: Secondary | ICD-10-CM | POA: Diagnosis not present

## 2021-09-23 DIAGNOSIS — Z1211 Encounter for screening for malignant neoplasm of colon: Secondary | ICD-10-CM | POA: Diagnosis not present

## 2021-09-23 DIAGNOSIS — I10 Essential (primary) hypertension: Secondary | ICD-10-CM | POA: Diagnosis not present

## 2021-09-23 DIAGNOSIS — E1165 Type 2 diabetes mellitus with hyperglycemia: Secondary | ICD-10-CM | POA: Diagnosis not present

## 2021-09-23 DIAGNOSIS — Z79899 Other long term (current) drug therapy: Secondary | ICD-10-CM | POA: Diagnosis not present

## 2021-09-23 DIAGNOSIS — Z0000 Encounter for general adult medical examination without abnormal findings: Secondary | ICD-10-CM | POA: Diagnosis not present

## 2021-09-23 DIAGNOSIS — R5383 Other fatigue: Secondary | ICD-10-CM | POA: Diagnosis not present

## 2021-09-23 DIAGNOSIS — R011 Cardiac murmur, unspecified: Secondary | ICD-10-CM | POA: Diagnosis not present

## 2021-09-23 DIAGNOSIS — E7800 Pure hypercholesterolemia, unspecified: Secondary | ICD-10-CM | POA: Diagnosis not present

## 2021-09-23 DIAGNOSIS — Z23 Encounter for immunization: Secondary | ICD-10-CM | POA: Diagnosis not present

## 2021-09-23 DIAGNOSIS — I5030 Unspecified diastolic (congestive) heart failure: Secondary | ICD-10-CM | POA: Diagnosis not present

## 2021-09-23 DIAGNOSIS — Z299 Encounter for prophylactic measures, unspecified: Secondary | ICD-10-CM | POA: Diagnosis not present

## 2021-09-26 DIAGNOSIS — E1165 Type 2 diabetes mellitus with hyperglycemia: Secondary | ICD-10-CM | POA: Diagnosis not present

## 2021-09-28 DIAGNOSIS — D7389 Other diseases of spleen: Secondary | ICD-10-CM | POA: Diagnosis not present

## 2021-09-28 DIAGNOSIS — R350 Frequency of micturition: Secondary | ICD-10-CM | POA: Diagnosis not present

## 2021-09-28 DIAGNOSIS — K429 Umbilical hernia without obstruction or gangrene: Secondary | ICD-10-CM | POA: Diagnosis not present

## 2021-09-28 DIAGNOSIS — R109 Unspecified abdominal pain: Secondary | ICD-10-CM | POA: Diagnosis not present

## 2021-09-28 DIAGNOSIS — K5730 Diverticulosis of large intestine without perforation or abscess without bleeding: Secondary | ICD-10-CM | POA: Diagnosis not present

## 2021-09-28 DIAGNOSIS — M549 Dorsalgia, unspecified: Secondary | ICD-10-CM | POA: Diagnosis not present

## 2021-09-28 DIAGNOSIS — K4020 Bilateral inguinal hernia, without obstruction or gangrene, not specified as recurrent: Secondary | ICD-10-CM | POA: Diagnosis not present

## 2021-09-28 DIAGNOSIS — I700 Atherosclerosis of aorta: Secondary | ICD-10-CM | POA: Diagnosis not present

## 2021-09-28 DIAGNOSIS — Z299 Encounter for prophylactic measures, unspecified: Secondary | ICD-10-CM | POA: Diagnosis not present

## 2021-09-30 DIAGNOSIS — M4807 Spinal stenosis, lumbosacral region: Secondary | ICD-10-CM | POA: Diagnosis not present

## 2021-09-30 DIAGNOSIS — M5126 Other intervertebral disc displacement, lumbar region: Secondary | ICD-10-CM | POA: Diagnosis not present

## 2021-09-30 DIAGNOSIS — M47816 Spondylosis without myelopathy or radiculopathy, lumbar region: Secondary | ICD-10-CM | POA: Diagnosis not present

## 2021-09-30 DIAGNOSIS — M48061 Spinal stenosis, lumbar region without neurogenic claudication: Secondary | ICD-10-CM | POA: Diagnosis not present

## 2021-10-03 DIAGNOSIS — R010 Benign and innocent cardiac murmurs: Secondary | ICD-10-CM | POA: Diagnosis not present

## 2021-10-10 DIAGNOSIS — M5416 Radiculopathy, lumbar region: Secondary | ICD-10-CM | POA: Diagnosis not present

## 2021-10-13 DIAGNOSIS — M5416 Radiculopathy, lumbar region: Secondary | ICD-10-CM | POA: Diagnosis not present

## 2021-11-04 ENCOUNTER — Telehealth (INDEPENDENT_AMBULATORY_CARE_PROVIDER_SITE_OTHER): Payer: Self-pay

## 2021-11-04 ENCOUNTER — Other Ambulatory Visit (INDEPENDENT_AMBULATORY_CARE_PROVIDER_SITE_OTHER): Payer: Self-pay

## 2021-11-04 ENCOUNTER — Encounter (INDEPENDENT_AMBULATORY_CARE_PROVIDER_SITE_OTHER): Payer: Self-pay

## 2021-11-04 DIAGNOSIS — Z1211 Encounter for screening for malignant neoplasm of colon: Secondary | ICD-10-CM

## 2021-11-04 MED ORDER — PEG 3350-KCL-NA BICARB-NACL 420 G PO SOLR: 4000.0000 mL | ORAL | 0 refills | Status: DC

## 2021-11-04 NOTE — Telephone Encounter (Signed)
LeighAnn Karesha Trzcinski, CMA  

## 2021-11-08 DIAGNOSIS — H35031 Hypertensive retinopathy, right eye: Secondary | ICD-10-CM | POA: Diagnosis not present

## 2021-11-08 DIAGNOSIS — H524 Presbyopia: Secondary | ICD-10-CM | POA: Diagnosis not present

## 2021-11-29 ENCOUNTER — Other Ambulatory Visit (INDEPENDENT_AMBULATORY_CARE_PROVIDER_SITE_OTHER): Payer: Self-pay

## 2021-11-30 ENCOUNTER — Other Ambulatory Visit (INDEPENDENT_AMBULATORY_CARE_PROVIDER_SITE_OTHER): Payer: Self-pay

## 2021-11-30 DIAGNOSIS — Z1211 Encounter for screening for malignant neoplasm of colon: Secondary | ICD-10-CM

## 2021-11-30 DIAGNOSIS — I10 Essential (primary) hypertension: Secondary | ICD-10-CM

## 2021-12-05 ENCOUNTER — Other Ambulatory Visit: Payer: Self-pay

## 2021-12-05 ENCOUNTER — Other Ambulatory Visit (HOSPITAL_COMMUNITY)
Admission: RE | Admit: 2021-12-05 | Discharge: 2021-12-05 | Disposition: A | Discharge status: Home / Self Care | Payer: Medicare Other | Source: Ambulatory Visit | Attending: Gastroenterology | Admitting: Gastroenterology

## 2021-12-05 ENCOUNTER — Other Ambulatory Visit (INDEPENDENT_AMBULATORY_CARE_PROVIDER_SITE_OTHER): Payer: Self-pay

## 2021-12-05 ENCOUNTER — Other Ambulatory Visit (INDEPENDENT_AMBULATORY_CARE_PROVIDER_SITE_OTHER): Payer: Self-pay | Admitting: Gastroenterology

## 2021-12-05 DIAGNOSIS — Z1211 Encounter for screening for malignant neoplasm of colon: Secondary | ICD-10-CM | POA: Diagnosis not present

## 2021-12-05 DIAGNOSIS — E876 Hypokalemia: Secondary | ICD-10-CM

## 2021-12-05 DIAGNOSIS — I10 Essential (primary) hypertension: Secondary | ICD-10-CM | POA: Diagnosis not present

## 2021-12-05 LAB — BASIC METABOLIC PANEL
Anion gap: 9 (ref 5–15)
BUN: 19 mg/dL (ref 8–23)
CO2: 30 mmol/L (ref 22–32)
Calcium: 8.8 mg/dL — ABNORMAL LOW (ref 8.9–10.3)
Chloride: 99 mmol/L (ref 98–111)
Creatinine, Ser: 1.14 mg/dL (ref 0.61–1.24)
GFR, Estimated: >60 mL/min (ref >60)
Glucose, Bld: 146 mg/dL — ABNORMAL HIGH (ref 70–99)
Potassium: 3 mmol/L — ABNORMAL LOW (ref 3.5–5.1)
Sodium: 138 mmol/L (ref 135–145)

## 2021-12-05 MED ORDER — POTASSIUM CHLORIDE CRYS ER 20 MEQ PO TBCR: 60.0000 meq | EXTENDED_RELEASE_TABLET | Freq: Once | ORAL | 0 refills | Status: DC

## 2021-12-07 ENCOUNTER — Encounter (HOSPITAL_COMMUNITY)
Admission: RE | Disposition: A | Discharge status: Home / Self Care | Payer: Self-pay | Source: Ambulatory Visit | Attending: Gastroenterology

## 2021-12-07 ENCOUNTER — Ambulatory Visit (HOSPITAL_COMMUNITY): Payer: Medicare Other | Admitting: Anesthesiology

## 2021-12-07 ENCOUNTER — Encounter (HOSPITAL_COMMUNITY): Payer: Self-pay | Admitting: Gastroenterology

## 2021-12-07 ENCOUNTER — Ambulatory Visit (HOSPITAL_COMMUNITY)
Admission: RE | Admit: 2021-12-07 | Discharge: 2021-12-07 | Disposition: A | Discharge status: Home / Self Care | Payer: Medicare Other | Source: Ambulatory Visit | Attending: Gastroenterology | Admitting: Gastroenterology

## 2021-12-07 ENCOUNTER — Other Ambulatory Visit: Payer: Self-pay

## 2021-12-07 DIAGNOSIS — I10 Essential (primary) hypertension: Secondary | ICD-10-CM | POA: Insufficient documentation

## 2021-12-07 DIAGNOSIS — Z1211 Encounter for screening for malignant neoplasm of colon: Secondary | ICD-10-CM | POA: Diagnosis not present

## 2021-12-07 DIAGNOSIS — K5730 Diverticulosis of large intestine without perforation or abscess without bleeding: Secondary | ICD-10-CM | POA: Insufficient documentation

## 2021-12-07 DIAGNOSIS — K219 Gastro-esophageal reflux disease without esophagitis: Secondary | ICD-10-CM | POA: Diagnosis not present

## 2021-12-07 DIAGNOSIS — F17200 Nicotine dependence, unspecified, uncomplicated: Secondary | ICD-10-CM | POA: Insufficient documentation

## 2021-12-07 DIAGNOSIS — K635 Polyp of colon: Secondary | ICD-10-CM | POA: Diagnosis not present

## 2021-12-07 DIAGNOSIS — K648 Other hemorrhoids: Secondary | ICD-10-CM | POA: Diagnosis not present

## 2021-12-07 DIAGNOSIS — J449 Chronic obstructive pulmonary disease, unspecified: Secondary | ICD-10-CM | POA: Insufficient documentation

## 2021-12-07 DIAGNOSIS — D123 Benign neoplasm of transverse colon: Secondary | ICD-10-CM | POA: Insufficient documentation

## 2021-12-07 DIAGNOSIS — F17210 Nicotine dependence, cigarettes, uncomplicated: Secondary | ICD-10-CM | POA: Diagnosis not present

## 2021-12-07 HISTORY — PX: COLONOSCOPY WITH PROPOFOL: SHX5780

## 2021-12-07 HISTORY — PX: POLYPECTOMY: SHX5525

## 2021-12-07 LAB — HM COLONOSCOPY

## 2021-12-07 SURGERY — COLONOSCOPY WITH PROPOFOL
Anesthesia: General

## 2021-12-07 MED ORDER — PROPOFOL 500 MG/50ML IV EMUL
INTRAVENOUS | Status: DC | PRN
  Administered 2021-12-07: 150 ug/kg/min via INTRAVENOUS

## 2021-12-07 MED ORDER — LACTATED RINGERS IV SOLN: INTRAVENOUS | Status: DC

## 2021-12-07 MED ORDER — DEXMEDETOMIDINE (PRECEDEX) IN NS 20 MCG/5ML (4 MCG/ML) IV SYRINGE
PREFILLED_SYRINGE | INTRAVENOUS | Status: DC | PRN
  Administered 2021-12-07 (×2): 10 ug via INTRAVENOUS

## 2021-12-07 MED ORDER — DEXMEDETOMIDINE (PRECEDEX) IN NS 20 MCG/5ML (4 MCG/ML) IV SYRINGE
PREFILLED_SYRINGE | INTRAVENOUS | Status: AC
  Filled 2021-12-07: qty 5

## 2021-12-07 MED ORDER — LIDOCAINE HCL (CARDIAC) PF 100 MG/5ML IV SOSY
PREFILLED_SYRINGE | INTRAVENOUS | Status: DC | PRN
  Administered 2021-12-07: 50 mg via INTRAVENOUS

## 2021-12-07 MED ORDER — PROPOFOL 10 MG/ML IV BOLUS
INTRAVENOUS | Status: DC | PRN
  Administered 2021-12-07: 100 mg via INTRAVENOUS
  Administered 2021-12-07: 30 mg via INTRAVENOUS
  Administered 2021-12-07: 50 mg via INTRAVENOUS
  Administered 2021-12-07: 30 mg via INTRAVENOUS

## 2021-12-07 NOTE — Transfer of Care (Signed)
Immediate Anesthesia Transfer of Care Note  Patient: Christian Carrillo  Procedure(s) Performed: COLONOSCOPY WITH PROPOFOL POLYPECTOMY  Patient Location: Endoscopy Unit  Anesthesia Type:General  Level of Consciousness: drowsy  Airway & Oxygen Therapy: Patient Spontanous Breathing  Post-op Assessment: Report given to RN and Post -op Vital signs reviewed and stable  Post vital signs: Reviewed and stable  Last Vitals:  Vitals Value Taken Time  BP    Temp    Pulse    Resp    SpO2      Last Pain:  Vitals:   12/07/21 1018  TempSrc:   PainSc: 0-No pain      Patients Stated Pain Goal: 10 (13/64/38 3779)  Complications: No notable events documented.

## 2021-12-07 NOTE — Op Note (Signed)
[facility] Patient Name: Christian Carrillo Procedure Date: 12/07/2021 10:08 AM MRN: [id number] Date of Birth: Jun 06, 1951 Attending MD: Maylon Peppers ,  CSN: [id number] Age: 71 Admit Type: Outpatient Procedure:                Colonoscopy Indications:              Screening for colorectal malignant neoplasm Providers:                Maylon Peppers, Hughie Closs, RN, Janeece Riggers,                            RN, Kristine L. Risa Grill, Technician Referring MD:              Medicines:                Monitored Anesthesia Care Complications:            No immediate complications. Estimated Blood Loss:     Estimated blood loss: none. Procedure:                Pre-Anesthesia Assessment:                           - Prior to the procedure, a History and Physical                            was performed, and patient medications, allergies                            and sensitivities were reviewed. The patient's                            tolerance of previous anesthesia was reviewed.                           - The risks and benefits of the procedure and the                            sedation options and risks were discussed with the                            patient. All questions were answered and informed                            consent was obtained.                           - ASA Grade Assessment: II - A patient with mild                            systemic disease.                           After obtaining informed consent, the colonoscope                            was passed under direct vision. Throughout  the                            procedure, the patient's blood pressure, pulse, and                            oxygen saturations were monitored continuously. The                            [id number]-[id number] ([id number]) scope was introduced through                            the anus and advanced to the the cecum, identified                            by appendiceal orifice and  ileocecal valve. The                            colonoscopy was performed without difficulty. The                            patient tolerated the procedure well. The quality                            of the bowel preparation was good. Scope In: 10:20:36 AM Scope Out: 10:47:06 AM Scope Withdrawal Time: 0 hours 18 minutes 58 seconds  Total Procedure Duration: 0 hours 26 minutes 30 seconds  Findings:      The perianal and digital rectal examinations were normal.      A 8 mm polyp was found in the transverse colon. The polyp was sessile.       The polyp was removed with a cold snare. Resection and retrieval were       complete.      Multiple small and large-mouthed diverticula were found in the       transverse colon.      Non-bleeding internal hemorrhoids were found during retroflexion. The       hemorrhoids were small. Impression:               - One 8 mm polyp in the transverse colon, removed                            with a cold snare. Resected and retrieved.                           - Diverticulosis in the transverse colon.                           - Non-bleeding internal hemorrhoids. Moderate Sedation:      Per Anesthesia Care Recommendation:           - Discharge patient to home (ambulatory).                           - Resume previous diet.                           -  Await pathology results.                           - Repeat colonoscopy for surveillance based on                            pathology results. Procedure Code(s):        --- Professional ---                           [phone], Colonoscopy, flexible; with removal of                            tumor(s), polyp(s), or other lesion(s) by snare                            technique Diagnosis Code(s):        --- Professional ---                           Z12.11, Encounter for screening for malignant                            neoplasm of colon                           K63.5, Polyp of colon                            K64.8, Other hemorrhoids                           K57.30, Diverticulosis of large intestine without                            perforation or abscess without bleeding CPT copyright 2019 American Medical Association. All rights reserved. The codes documented in this report are preliminary and upon coder review may  be revised to meet current compliance requirements. Maylon Peppers, MD Maylon Peppers,  12/07/2021 10:53:09 AM This report has been signed electronically. Number of Addenda: 0

## 2021-12-07 NOTE — Discharge Instructions (Addendum)
You are being discharged to home.  Resume your previous diet.  We are waiting for your pathology results.  Your physician has recommended a repeat colonoscopy for surveillance based on pathology results.  

## 2021-12-07 NOTE — H&P (Signed)
Christian Carrillo is an 71 y.o. male.   Chief Complaint: screening colonoscopy HPI: Christian Carrillo is a 71 y.o. male with medical history of COPD, depression, GERD, hypertension and jackhammer esophagus, who presents for screening colonoscopy.  Last colonoscopy was performed in 2012 which was normal.  The patient denies having any complaints such as melena, hematochezia, abdominal pain or distention, change in her bowel movement consistency or frequency, no changes in her weight recently.  No family history of colorectal cancer.   Past Medical History:  Diagnosis Date   Anxiety    Arthritis    Chronic back pain    COPD (chronic obstructive pulmonary disease) ([facility])    Depression    GERD (gastroesophageal reflux disease)    Hepatitis    Hypertension    Localized edema    Other emphysema ([facility])    Shortness of breath dyspnea     Past Surgical History:  Procedure Laterality Date   BACK SURGERY     BIOPSY  02/18/2020   Procedure: BIOPSY;  Surgeon: Rogene Houston, MD;  [facility];  Service: Endoscopy;;  gastric   cataract surgery     x 1 eye   ESOPHAGEAL DILATION N/A 02/18/2020   Procedure: ESOPHAGEAL DILATION;  Surgeon: Rogene Houston, MD;  [facility];  Service: Endoscopy;  Laterality: N/A;   ESOPHAGOGASTRODUODENOSCOPY N/A 05/03/2016   Procedure: ESOPHAGOGASTRODUODENOSCOPY (EGD);  Surgeon: Rogene Houston, MD;  [facility];  Service: Endoscopy;  Laterality: N/A;  1:00 - moved to 6/7 @ 7:30 - Ann notified pt   ESOPHAGOGASTRODUODENOSCOPY N/A 02/18/2020   Procedure: ESOPHAGOGASTRODUODENOSCOPY (EGD);  Surgeon: Rogene Houston, MD;  [facility];  Service: Endoscopy;  Laterality: N/A;  240   rt femur fx     traction   rt shoulder surgery pinning.       Family History  Problem Relation Age of Onset   Obesity Mother    Hypothyroidism Mother    Diabetes Mellitus II Mother    Cancer Father    Heart attack Brother    Diabetes Mellitus II  Brother    Social History:  reports that he has been smoking cigarettes. He has a 30.00 pack-year smoking history. He has never used smokeless tobacco. He reports that he does not drink alcohol and does not use drugs.  Allergies:  Allergies  Allergen Reactions   Codeine     itching    Medications Prior to Admission  Medication Sig Dispense Refill   amitriptyline (ELAVIL) 25 MG tablet Take 25 mg by mouth at bedtime.     Aspirin-Acetaminophen-Caffeine (GOODYS EXTRA STRENGTH PO) Take 1 Package by mouth daily as needed (headache and body ache). This is the powder form , no more thatn 6 per day.     chlorthalidone (HYGROTON) 25 MG tablet Take 25 mg by mouth daily.     cloNIDine (CATAPRES) 0.1 MG tablet Take 0.1 mg by mouth 2 (two) times daily.     diltiazem (CARDIZEM) 60 MG tablet Take 1 tablet (60 mg total) by mouth every 8 (eight) hours as needed (chest discomfort). (Patient taking differently: Take 60 mg by mouth 2 (two) times daily.) 90 tablet 3   FLUoxetine (PROZAC) 20 MG capsule Take 20 mg by mouth daily.   0   losartan (COZAAR) 100 MG tablet TAKE 1 TABLET(100 MG) BY MOUTH DAILY (Patient taking differently: Take 100 mg by mouth at bedtime.) 90 tablet 3   omeprazole (PRILOSEC) 40  MG capsule Take 1 capsule (40 mg total) by mouth 2 (two) times daily. 180 capsule 3   polyethylene glycol-electrolytes (TRILYTE) 420 g solution Take 4,000 mLs by mouth as directed. 4000 mL 0   pravastatin (PRAVACHOL) 40 MG tablet Take 1 tablet (40 mg total) by mouth daily. Please schedule annual appt for refills. [id number]. 1st attempt. (Patient taking differently: Take 40 mg by mouth at bedtime.) 30 tablet 0   tamsulosin (FLOMAX) 0.4 MG CAPS capsule Take 0.4 mg by mouth daily after supper.     testosterone cypionate (DEPOTESTOTERONE CYPIONATE) 100 MG/ML injection Inject 100 mg into the muscle every 30 (thirty) days.     Budeson-Glycopyrrol-Formoterol (BREZTRI AEROSPHERE) 160-9-4.8 MCG/ACT AERO Inhale 2 puffs  into the lungs 2 (two) times daily.     potassium chloride SA (KLOR-CON M) 20 MEQ tablet Take 3 tablets (60 mEq total) by mouth once for 1 dose. Take prior to your colonoscopy 9 tablet 0    No results found for this or any previous visit (from the past 48 hour(s)). No results found.  Review of Systems  Constitutional: Negative.   HENT: Negative.    Eyes: Negative.   Respiratory: Negative.    Cardiovascular: Negative.   Gastrointestinal: Negative.   Endocrine: Negative.   Genitourinary: Negative.   Musculoskeletal: Negative.   Skin: Negative.   Allergic/Immunologic: Negative.   Neurological: Negative.   Hematological: Negative.   Psychiatric/Behavioral: Negative.     Blood pressure 127/72, pulse 83, temperature 97.8 F (36.6 C), temperature source Oral, resp. rate 20, height 6' (1.829 m), weight 99.8 kg, SpO2 99 %. Physical Exam  GENERAL: The patient is AO x3, in no acute distress. HEENT: Head is normocephalic and atraumatic. EOMI are intact. Mouth is well hydrated and without lesions. NECK: Supple. No masses LUNGS: Clear to auscultation. No presence of rhonchi/wheezing/rales. Adequate chest expansion HEART: RRR, normal s1 and s2. ABDOMEN: Soft, nontender, no guarding, no peritoneal signs, and nondistended. BS +. No masses. EXTREMITIES: Without any cyanosis, clubbing, rash, lesions or edema. NEUROLOGIC: AOx3, no focal motor deficit. SKIN: no jaundice, no rashes  Assessment/Plan Christian Carrillo is a 71 y.o. male with medical history of COPD, depression, GERD, hypertension and jackhammer esophagus, who presents for screening colonoscopy. The patient is at average risk for colorectal cancer.  We will proceed with colonoscopy today.   Harvel Quale, MD 12/07/2021, 9:52 AM

## 2021-12-07 NOTE — Anesthesia Preprocedure Evaluation (Signed)
Anesthesia Evaluation  Patient identified by MRN, date of birth, ID band Patient awake    Reviewed: Allergy & Precautions, H&P , NPO status , Patient's Chart, lab work & pertinent test results, reviewed documented beta blocker date and time   Airway Mallampati: II  TM Distance: >3 FB Neck ROM: full    Dental no notable dental hx.    Pulmonary shortness of breath, COPD,  COPD inhaler, Current Smoker,    Pulmonary exam normal breath sounds clear to auscultation       Cardiovascular Exercise Tolerance: Good hypertension, negative cardio ROS   Rhythm:regular Rate:Normal     Neuro/Psych PSYCHIATRIC DISORDERS Anxiety Depression negative neurological ROS     GI/Hepatic GERD  Medicated,(+) Hepatitis -  Endo/Other  negative endocrine ROS  Renal/GU negative Renal ROS  negative genitourinary   Musculoskeletal   Abdominal   Peds  Hematology negative hematology ROS (+)   Anesthesia Other Findings   Reproductive/Obstetrics negative OB ROS                             Anesthesia Physical Anesthesia Plan  ASA: 3  Anesthesia Plan: General   Post-op Pain Management:    Induction:   PONV Risk Score and Plan:   Airway Management Planned:   Additional Equipment:   Intra-op Plan:   Post-operative Plan:   Informed Consent: I have reviewed the patients History and Physical, chart, labs and discussed the procedure including the risks, benefits and alternatives for the proposed anesthesia with the patient or authorized representative who has indicated his/her understanding and acceptance.     Dental Advisory Given  Plan Discussed with: CRNA  Anesthesia Plan Comments:         Anesthesia Quick Evaluation

## 2021-12-07 NOTE — Anesthesia Procedure Notes (Signed)
Date/Time: 12/07/2021 10:27 AM Performed by: Orlie Dakin, CRNA Pre-anesthesia Checklist: Patient identified, Emergency Drugs available, Suction available and Patient being monitored Patient Re-evaluated:Patient Re-evaluated prior to induction Oxygen Delivery Method: Nasal cannula Induction Type: IV induction Placement Confirmation: positive ETCO2

## 2021-12-07 NOTE — Anesthesia Postprocedure Evaluation (Signed)
Anesthesia Post Note  Patient: Christian Carrillo  Procedure(s) Performed: COLONOSCOPY WITH PROPOFOL POLYPECTOMY  Patient location during evaluation: Phase II Anesthesia Type: General Level of consciousness: awake Pain management: pain level controlled Vital Signs Assessment: post-procedure vital signs reviewed and stable Respiratory status: spontaneous breathing and respiratory function stable Cardiovascular status: blood pressure returned to baseline and stable Postop Assessment: no headache and no apparent nausea or vomiting Anesthetic complications: no Comments: Late entry   No notable events documented.   Last Vitals:  Vitals:   12/07/21 0831 12/07/21 1049  BP: 127/72 (!) 113/46  Pulse: 83 62  Resp: 20 (!) 22  Temp: 36.6 C 36.5 C  SpO2: 99% 98%    Last Pain:  Vitals:   12/07/21 1059  TempSrc:   PainSc: 0-No pain                 Louann Sjogren

## 2021-12-08 LAB — SURGICAL PATHOLOGY

## 2021-12-09 ENCOUNTER — Encounter (HOSPITAL_COMMUNITY): Payer: Self-pay | Admitting: Gastroenterology

## 2021-12-09 ENCOUNTER — Encounter (INDEPENDENT_AMBULATORY_CARE_PROVIDER_SITE_OTHER): Payer: Self-pay | Admitting: *Deleted

## 2021-12-23 DIAGNOSIS — M5416 Radiculopathy, lumbar region: Secondary | ICD-10-CM | POA: Diagnosis not present

## 2021-12-23 DIAGNOSIS — R350 Frequency of micturition: Secondary | ICD-10-CM | POA: Diagnosis not present

## 2021-12-23 DIAGNOSIS — I4510 Unspecified right bundle-branch block: Secondary | ICD-10-CM | POA: Diagnosis not present

## 2021-12-23 DIAGNOSIS — R55 Syncope and collapse: Secondary | ICD-10-CM | POA: Diagnosis not present

## 2021-12-23 DIAGNOSIS — Z299 Encounter for prophylactic measures, unspecified: Secondary | ICD-10-CM | POA: Diagnosis not present

## 2021-12-23 DIAGNOSIS — I10 Essential (primary) hypertension: Secondary | ICD-10-CM | POA: Diagnosis not present

## 2021-12-23 DIAGNOSIS — I700 Atherosclerosis of aorta: Secondary | ICD-10-CM | POA: Diagnosis not present

## 2021-12-26 ENCOUNTER — Other Ambulatory Visit (INDEPENDENT_AMBULATORY_CARE_PROVIDER_SITE_OTHER): Payer: Self-pay | Admitting: Gastroenterology

## 2021-12-26 DIAGNOSIS — K224 Dyskinesia of esophagus: Secondary | ICD-10-CM

## 2022-01-05 ENCOUNTER — Ambulatory Visit (INDEPENDENT_AMBULATORY_CARE_PROVIDER_SITE_OTHER): Payer: Medicare Other | Admitting: Gastroenterology

## 2022-01-06 DIAGNOSIS — M25559 Pain in unspecified hip: Secondary | ICD-10-CM | POA: Diagnosis not present

## 2022-01-06 DIAGNOSIS — F17210 Nicotine dependence, cigarettes, uncomplicated: Secondary | ICD-10-CM | POA: Diagnosis not present

## 2022-01-06 DIAGNOSIS — M549 Dorsalgia, unspecified: Secondary | ICD-10-CM | POA: Diagnosis not present

## 2022-01-06 DIAGNOSIS — Z299 Encounter for prophylactic measures, unspecified: Secondary | ICD-10-CM | POA: Diagnosis not present

## 2022-01-06 DIAGNOSIS — I10 Essential (primary) hypertension: Secondary | ICD-10-CM | POA: Diagnosis not present

## 2022-01-06 DIAGNOSIS — E1165 Type 2 diabetes mellitus with hyperglycemia: Secondary | ICD-10-CM | POA: Diagnosis not present

## 2022-01-13 DIAGNOSIS — M5416 Radiculopathy, lumbar region: Secondary | ICD-10-CM | POA: Diagnosis not present

## 2022-01-18 DIAGNOSIS — I739 Peripheral vascular disease, unspecified: Secondary | ICD-10-CM | POA: Diagnosis not present

## 2022-02-03 ENCOUNTER — Other Ambulatory Visit (INDEPENDENT_AMBULATORY_CARE_PROVIDER_SITE_OTHER): Payer: Self-pay | Admitting: Gastroenterology

## 2022-02-03 DIAGNOSIS — K224 Dyskinesia of esophagus: Secondary | ICD-10-CM

## 2022-02-09 DIAGNOSIS — M5416 Radiculopathy, lumbar region: Secondary | ICD-10-CM | POA: Diagnosis not present

## 2022-04-04 DIAGNOSIS — M19031 Primary osteoarthritis, right wrist: Secondary | ICD-10-CM | POA: Diagnosis not present

## 2022-04-04 DIAGNOSIS — M25531 Pain in right wrist: Secondary | ICD-10-CM | POA: Diagnosis not present

## 2022-04-05 DIAGNOSIS — E1165 Type 2 diabetes mellitus with hyperglycemia: Secondary | ICD-10-CM | POA: Diagnosis not present

## 2022-04-05 DIAGNOSIS — I5030 Unspecified diastolic (congestive) heart failure: Secondary | ICD-10-CM | POA: Diagnosis not present

## 2022-04-05 DIAGNOSIS — Z0000 Encounter for general adult medical examination without abnormal findings: Secondary | ICD-10-CM | POA: Diagnosis not present

## 2022-04-05 DIAGNOSIS — Z299 Encounter for prophylactic measures, unspecified: Secondary | ICD-10-CM | POA: Diagnosis not present

## 2022-04-05 DIAGNOSIS — I10 Essential (primary) hypertension: Secondary | ICD-10-CM | POA: Diagnosis not present

## 2022-04-14 DIAGNOSIS — M5416 Radiculopathy, lumbar region: Secondary | ICD-10-CM | POA: Diagnosis not present

## 2022-04-14 DIAGNOSIS — M47816 Spondylosis without myelopathy or radiculopathy, lumbar region: Secondary | ICD-10-CM | POA: Diagnosis not present

## 2022-04-14 DIAGNOSIS — M25531 Pain in right wrist: Secondary | ICD-10-CM | POA: Diagnosis not present

## 2022-04-18 DIAGNOSIS — M19031 Primary osteoarthritis, right wrist: Secondary | ICD-10-CM | POA: Diagnosis not present

## 2022-04-18 DIAGNOSIS — M24542 Contracture, left hand: Secondary | ICD-10-CM | POA: Diagnosis not present

## 2022-05-02 DIAGNOSIS — M25531 Pain in right wrist: Secondary | ICD-10-CM | POA: Diagnosis not present

## 2022-05-02 DIAGNOSIS — M79645 Pain in left finger(s): Secondary | ICD-10-CM | POA: Diagnosis not present

## 2022-05-02 DIAGNOSIS — M720 Palmar fascial fibromatosis [Dupuytren]: Secondary | ICD-10-CM | POA: Diagnosis not present

## 2022-05-15 ENCOUNTER — Telehealth (INDEPENDENT_AMBULATORY_CARE_PROVIDER_SITE_OTHER): Payer: Self-pay

## 2022-05-15 ENCOUNTER — Other Ambulatory Visit: Payer: Self-pay

## 2022-05-15 DIAGNOSIS — M720 Palmar fascial fibromatosis [Dupuytren]: Secondary | ICD-10-CM | POA: Diagnosis not present

## 2022-05-15 DIAGNOSIS — K224 Dyskinesia of esophagus: Secondary | ICD-10-CM

## 2022-05-15 MED ORDER — DILTIAZEM HCL 60 MG PO TABS: ORAL_TABLET | ORAL | 3 refills | Status: DC

## 2022-05-15 NOTE — Telephone Encounter (Signed)
Last seen in office 03/28/2021 had procedure TCS 12/07/2021.

## 2022-05-18 DIAGNOSIS — M720 Palmar fascial fibromatosis [Dupuytren]: Secondary | ICD-10-CM | POA: Diagnosis not present

## 2022-05-18 DIAGNOSIS — M25642 Stiffness of left hand, not elsewhere classified: Secondary | ICD-10-CM | POA: Diagnosis not present

## 2022-05-18 DIAGNOSIS — Z4889 Encounter for other specified surgical aftercare: Secondary | ICD-10-CM | POA: Diagnosis not present

## 2022-05-24 DIAGNOSIS — H6690 Otitis media, unspecified, unspecified ear: Secondary | ICD-10-CM | POA: Diagnosis not present

## 2022-05-24 DIAGNOSIS — I10 Essential (primary) hypertension: Secondary | ICD-10-CM | POA: Diagnosis not present

## 2022-05-24 DIAGNOSIS — Z299 Encounter for prophylactic measures, unspecified: Secondary | ICD-10-CM | POA: Diagnosis not present

## 2022-05-24 DIAGNOSIS — E860 Dehydration: Secondary | ICD-10-CM | POA: Diagnosis not present

## 2022-05-24 DIAGNOSIS — F17210 Nicotine dependence, cigarettes, uncomplicated: Secondary | ICD-10-CM | POA: Diagnosis not present

## 2022-05-24 DIAGNOSIS — L729 Follicular cyst of the skin and subcutaneous tissue, unspecified: Secondary | ICD-10-CM | POA: Diagnosis not present

## 2022-05-29 DIAGNOSIS — I10 Essential (primary) hypertension: Secondary | ICD-10-CM | POA: Diagnosis not present

## 2022-05-29 DIAGNOSIS — I5030 Unspecified diastolic (congestive) heart failure: Secondary | ICD-10-CM | POA: Diagnosis not present

## 2022-05-29 DIAGNOSIS — Z299 Encounter for prophylactic measures, unspecified: Secondary | ICD-10-CM | POA: Diagnosis not present

## 2022-06-21 DIAGNOSIS — U071 COVID-19: Secondary | ICD-10-CM | POA: Diagnosis not present

## 2022-06-21 DIAGNOSIS — Z299 Encounter for prophylactic measures, unspecified: Secondary | ICD-10-CM | POA: Diagnosis not present

## 2022-06-21 DIAGNOSIS — R059 Cough, unspecified: Secondary | ICD-10-CM | POA: Diagnosis not present

## 2022-06-27 DIAGNOSIS — F17210 Nicotine dependence, cigarettes, uncomplicated: Secondary | ICD-10-CM | POA: Diagnosis not present

## 2022-06-27 DIAGNOSIS — Z299 Encounter for prophylactic measures, unspecified: Secondary | ICD-10-CM | POA: Diagnosis not present

## 2022-06-27 DIAGNOSIS — I10 Essential (primary) hypertension: Secondary | ICD-10-CM | POA: Diagnosis not present

## 2022-06-27 DIAGNOSIS — M25551 Pain in right hip: Secondary | ICD-10-CM | POA: Diagnosis not present

## 2022-07-06 DIAGNOSIS — F17210 Nicotine dependence, cigarettes, uncomplicated: Secondary | ICD-10-CM | POA: Diagnosis not present

## 2022-07-06 DIAGNOSIS — I10 Essential (primary) hypertension: Secondary | ICD-10-CM | POA: Diagnosis not present

## 2022-07-06 DIAGNOSIS — Z299 Encounter for prophylactic measures, unspecified: Secondary | ICD-10-CM | POA: Diagnosis not present

## 2022-07-06 DIAGNOSIS — E1165 Type 2 diabetes mellitus with hyperglycemia: Secondary | ICD-10-CM | POA: Diagnosis not present

## 2022-07-06 DIAGNOSIS — I5030 Unspecified diastolic (congestive) heart failure: Secondary | ICD-10-CM | POA: Diagnosis not present

## 2022-08-07 DIAGNOSIS — M549 Dorsalgia, unspecified: Secondary | ICD-10-CM | POA: Diagnosis not present

## 2022-09-25 DIAGNOSIS — I5032 Chronic diastolic (congestive) heart failure: Secondary | ICD-10-CM | POA: Diagnosis not present

## 2022-09-25 DIAGNOSIS — E1142 Type 2 diabetes mellitus with diabetic polyneuropathy: Secondary | ICD-10-CM | POA: Diagnosis not present

## 2022-09-25 DIAGNOSIS — Z299 Encounter for prophylactic measures, unspecified: Secondary | ICD-10-CM | POA: Diagnosis not present

## 2022-09-25 DIAGNOSIS — I10 Essential (primary) hypertension: Secondary | ICD-10-CM | POA: Diagnosis not present

## 2022-09-25 DIAGNOSIS — Z23 Encounter for immunization: Secondary | ICD-10-CM | POA: Diagnosis not present

## 2022-09-25 DIAGNOSIS — Z7189 Other specified counseling: Secondary | ICD-10-CM | POA: Diagnosis not present

## 2022-09-25 DIAGNOSIS — Z0000 Encounter for general adult medical examination without abnormal findings: Secondary | ICD-10-CM | POA: Diagnosis not present

## 2022-09-26 DIAGNOSIS — Z79899 Other long term (current) drug therapy: Secondary | ICD-10-CM | POA: Diagnosis not present

## 2022-09-26 DIAGNOSIS — I10 Essential (primary) hypertension: Secondary | ICD-10-CM | POA: Diagnosis not present

## 2022-09-26 DIAGNOSIS — R5383 Other fatigue: Secondary | ICD-10-CM | POA: Diagnosis not present

## 2022-09-26 DIAGNOSIS — K219 Gastro-esophageal reflux disease without esophagitis: Secondary | ICD-10-CM | POA: Diagnosis not present

## 2022-09-26 DIAGNOSIS — E7800 Pure hypercholesterolemia, unspecified: Secondary | ICD-10-CM | POA: Diagnosis not present

## 2022-09-29 DIAGNOSIS — M47816 Spondylosis without myelopathy or radiculopathy, lumbar region: Secondary | ICD-10-CM | POA: Diagnosis not present

## 2022-12-19 ENCOUNTER — Other Ambulatory Visit (INDEPENDENT_AMBULATORY_CARE_PROVIDER_SITE_OTHER): Payer: Self-pay | Admitting: Gastroenterology

## 2022-12-19 DIAGNOSIS — K224 Dyskinesia of esophagus: Secondary | ICD-10-CM

## 2023-02-22 ENCOUNTER — Ambulatory Visit (INDEPENDENT_AMBULATORY_CARE_PROVIDER_SITE_OTHER): Payer: Medicare Other | Admitting: Gastroenterology

## 2023-02-22 ENCOUNTER — Encounter (INDEPENDENT_AMBULATORY_CARE_PROVIDER_SITE_OTHER): Payer: Self-pay | Admitting: Gastroenterology

## 2023-02-22 VITALS — BP 106/64 | HR 65 | Temp 99.1°F | Ht 72.0 in | Wt 208.6 lb

## 2023-02-22 DIAGNOSIS — K219 Gastro-esophageal reflux disease without esophagitis: Secondary | ICD-10-CM | POA: Diagnosis not present

## 2023-02-22 DIAGNOSIS — R197 Diarrhea, unspecified: Secondary | ICD-10-CM

## 2023-02-22 DIAGNOSIS — R194 Change in bowel habit: Secondary | ICD-10-CM

## 2023-02-22 LAB — TSH: TSH: 5.72 mIU/L — ABNORMAL HIGH (ref 0.40–4.50)

## 2023-02-22 NOTE — Progress Notes (Addendum)
Referring Provider: Monico Blitz, MD Primary Care Physician:  Monico Blitz, MD Primary GI Physician: Jenetta Downer   Chief Complaint  Patient presents with   Diarrhea    Follow up on diarrhea. Started about one month ago. Was discharged from [place] yesterday.    Nausea    Has some nausea in the mornings that has been going on for awhile.    HPI:   Christian Carrillo is a 72 y.o. male with past medical history of COPD, depression, GERD, hypertension and jackhammer esophagus   Patient presenting today for follow up of diarrhea and nausea   Last seen may 2022, at that time, having heartburn despite taking protonix 40mg  BID, taking pepto bismol PRN  Recommended to have pH impedence/esophageal manometry on PPI, schedule colonoscopy, continue protonix 40mg  BID.  Manometry and impedence testing as outlined below, recommended to have referral to [facility] for further evaluation if symptoms persisted despite max dosage of PPI.  Today, Patient arrives with his wife who states that for the past 3 weeks he has been having diarrhea. States that diarrhea came on suddenly, noting a BM as soon as he eats. He has a BM atleast everytime he eats, up to 3-4x/day. Stools are very watery. Denies abdominal pain. He has some nausea though this is intermittent and more of a chronic issue.  Denies rectal bleeding or melena. Has lost about 20 pounds over the past month since symptoms began. Was notably on colchicine at the beginning of February. Thought diarrhea was secondary to this but once it was stopped, diarrhea persisted. They saw PCP in the interim and was sent to the ED over the weekend with very low potassium and magnesium. Wife reports that PO intake is minimal. C diff testing during admission on 02/22/23 was negative. Wife reports he was taking up to 6 goody powders per day for the past few years, he stopped these about 6 weeks ago. He was previously on opiate pain medications but has not used these for about 7  years. Denies fevers or chills though has chronic night sweats for years.   CT A/P with contrast on 3/25 without acute findings. Denies any abx therapy other than rocephin and doxycycline in the hospital over the weekend.   At baseline, BMs are normal, formed stools, once a day.   States they were unsure what they were supposed to do after ph impedence and manometry testing. He is taking protonix 40mg  BID, taking pepto bismol often for breakthrough symptoms. Has tried omeprazole, and nexium without good results in the past.   06/29/2021.  Esophageal manometry showed presence of a borderline IRP of 15.1, with presence of 2 cm hiatal hernia, there was also presence of normal peristalsis.  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  On the other hand the pH impedance study showed presence of 6% acid exposure while on PPI but there was borderline correlation of the episodes of reflux based on the [place] consensus (3/7at 42.9%) with an association probability of 93.4%.  Nevertheless, the DeMeester score was 27.8.   Last Colonoscopy:11/2021  - One 8 mm polyp in the transverse colon, removed                            with a cold snare. Resected and retrieved.                           - Diverticulosis  in the transverse colon.                           - Non-bleeding internal hemorrhoids. Last Endoscopy:2021 - Normal hypopharynx.                           - Benign-appearing esophageal stenosis. Dilated.                           - Z-line irregular, 41 cm from the incisors.                           - 2 cm hiatal hernia.                           - Gastritis. Biopsied.                           - Normal duodenal bulb and second portion of the                            duodenum.  Recommendations:    Past Medical History:  Diagnosis Date   Anxiety    Arthritis    Chronic back pain    COPD (chronic obstructive pulmonary disease) (HCC)    Depression    GERD (gastroesophageal reflux disease)     Hepatitis    Hypertension    Localized edema    Other emphysema (HCC)    Shortness of breath dyspnea     Past Surgical History:  Procedure Laterality Date   BACK SURGERY     BIOPSY  02/18/2020   Procedure: BIOPSY;  Surgeon: Rogene Houston, MD;  [facility];  Service: Endoscopy;;  gastric   cataract surgery     x 1 eye   COLONOSCOPY WITH PROPOFOL N/A 12/07/2021   Procedure: COLONOSCOPY WITH PROPOFOL;  Surgeon: Harvel Quale, MD;  [facility];  Service: Gastroenterology;  Laterality: N/A;  9:45   ESOPHAGEAL DILATION N/A 02/18/2020   Procedure: ESOPHAGEAL DILATION;  Surgeon: Rogene Houston, MD;  [facility];  Service: Endoscopy;  Laterality: N/A;   ESOPHAGOGASTRODUODENOSCOPY N/A 05/03/2016   Procedure: ESOPHAGOGASTRODUODENOSCOPY (EGD);  Surgeon: Rogene Houston, MD;  [facility];  Service: Endoscopy;  Laterality: N/A;  1:00 - moved to 6/7 @ 7:30 - Ann notified pt   ESOPHAGOGASTRODUODENOSCOPY N/A 02/18/2020   Procedure: ESOPHAGOGASTRODUODENOSCOPY (EGD);  Surgeon: Rogene Houston, MD;  [facility];  Service: Endoscopy;  Laterality: N/A;  240   POLYPECTOMY  12/07/2021   Procedure: POLYPECTOMY;  Surgeon: Montez Morita, Quillian Quince, MD;  [facility];  Service: Gastroenterology;;   rt femur fx     traction   rt shoulder surgery pinning.       Current Outpatient Medications  Medication Sig Dispense Refill   amitriptyline (ELAVIL) 25 MG tablet Take 25 mg by mouth at bedtime.     diltiazem (CARDIZEM) 60 MG tablet TAKE 1 TABLET(60 MG) BY MOUTH EVERY 8 HOURS AS NEEDED FOR CHEST DISCOMFORT 90 tablet 3   FLUoxetine (PROZAC) 20 MG capsule Take 20 mg by mouth daily.   0   pantoprazole (PROTONIX) 40 MG tablet Take 40 mg by mouth  2 (two) times daily.     potassium chloride (KLOR-CON) 10 MEQ tablet Take 10 mEq by mouth daily.     pravastatin (PRAVACHOL) 40 MG tablet Take 1 tablet (40 mg total) by mouth daily. Please  schedule annual appt for refills. [phone]. 1st attempt. (Patient taking differently: Take 40 mg by mouth at bedtime.) 30 tablet 0   tamsulosin (FLOMAX) 0.4 MG CAPS capsule Take 0.4 mg by mouth daily after supper.     tiZANidine (ZANAFLEX) 4 MG tablet Take 4 mg by mouth 2 (two) times daily.     chlorthalidone (HYGROTON) 25 MG tablet Take 25 mg by mouth daily. (Patient not taking: Reported on 02/22/2023)     cloNIDine (CATAPRES) 0.1 MG tablet Take 0.1 mg by mouth 2 (two) times daily. (Patient not taking: Reported on 02/22/2023)     losartan (COZAAR) 100 MG tablet TAKE 1 TABLET(100 MG) BY MOUTH DAILY (Patient not taking: Reported on 02/22/2023) 90 tablet 3   omeprazole (PRILOSEC) 40 MG capsule Take 1 capsule (40 mg total) by mouth 2 (two) times daily. (Patient not taking: Reported on 02/22/2023) 180 capsule 3   polyethylene glycol-electrolytes (TRILYTE) 420 g solution Take 4,000 mLs by mouth as directed. (Patient not taking: Reported on 02/22/2023) 4000 mL 0   No current facility-administered medications for this visit.    Allergies as of 02/22/2023 - Review Complete 12/07/2021  Allergen Reaction Noted   Codeine  04/11/2016    Family History  Problem Relation Age of Onset   Obesity Mother    Hypothyroidism Mother    Diabetes Mellitus II Mother    Cancer Father    Heart attack Brother    Diabetes Mellitus II Brother     Social History   Socioeconomic History   Marital status: Married    Spouse name: Not on file   Number of children: Not on file   Years of education: Not on file   Highest education level: Not on file  Occupational History   Not on file  Tobacco Use   Smoking status: Every Day    Packs/day: 1.00    Years: 30.00    Additional pack years: 0.00    Total pack years: 30.00    Types: Cigarettes    Last attempt to quit: 03/06/2016    Years since quitting: 6.9   Smokeless tobacco: Never  Vaping Use   Vaping Use: Never used  Substance and Sexual Activity   Alcohol  use: No    Alcohol/week: 0.0 standard drinks of alcohol   Drug use: No   Sexual activity: Not on file  Other Topics Concern   Not on file  Social History Narrative   Not on file   Social Determinants of Health   Financial Resource Strain: Not on file  Food Insecurity: Not on file  Transportation Needs: Not on file  Physical Activity: Not on file  Stress: Not on file  Social Connections: Not on file   Review of systems General: negative for malaise, night sweats, fever, chills, weight loss Neck: Negative for lumps, goiter, pain and significant neck swelling Resp: Negative for cough, wheezing, dyspnea at rest CV: Negative for chest pain, leg swelling, palpitations, orthopnea GI: denies melena, hematochezia,vomiting, constipation, dysphagia, odyonophagia, early satiety or unintentional weight loss. +diarrhea +nausea +GERD symptoms  MSK: Negative for joint pain or swelling, back pain, and muscle pain. Derm: Negative for itching or rash Psych: Denies depression, anxiety, memory loss, confusion. No homicidal or suicidal ideation.  Heme: Negative for  prolonged bleeding, bruising easily, and swollen nodes. Endocrine: Negative for cold or heat intolerance, polyuria, polydipsia and goiter. Neuro: negative for tremor, gait imbalance, syncope and seizures. The remainder of the review of systems is noncontributory.  Physical Exam: BP 106/64 (BP Location: Left Arm, Patient Position: Sitting, Cuff Size: Large)   Pulse 65   Temp 99.1 F (37.3 C) (Oral)   Ht 6' (1.829 m)   Wt 208 lb 9.6 oz (94.6 kg)   BMI 28.29 kg/m  General:   Alert and oriented. No distress noted. Pleasant and cooperative.  Head:  Normocephalic and atraumatic. Eyes:  Conjuctiva clear without scleral icterus. Mouth:  Oral mucosa pink and moist. Good dentition. No lesions. Heart: Normal rate and rhythm, s1 and s2 heart sounds present.  Lungs: Clear lung sounds in all lobes. Respirations equal and unlabored. Abdomen:   +BS, soft, non-tender and non-distended. No rebound or guarding. No HSM or masses noted. Derm: No palmar erythema or jaundice Msk:  Symmetrical without gross deformities. Normal posture. Extremities:  Without edema. Neurologic:  Alert and  oriented x4 Psych:  Alert and cooperative. Normal mood and affect.  Invalid input(s): "6 MONTHS"   ASSESSMENT: KENICHI TREU is a 73 y.o. male presenting today for diarrhea, ongoing GERD symptoms   Diarrhea: x3 weeks, no antibiotic therapy prior to onset. Recent C diff testing negative. Was on colchicine in February and though diarrhea was secondary to this though continues to have diarrhea since this was stopped. Notably was taking multiple goody powders per day for years up until about 6 weeks ago. Etiology of diarrhea unclear at this time. Will check GI pathogen panel to rule out other underlying infectious etiology, as well as TSH, Fecal fat and pancreatic elastase. It is possible diarrhea is still secondary to colchicine, though given time frame I would expect for this to be resolved by now. Could be some aspect of NSAID withdrawal was he was taking high doses daily for years. No rectal bleeding or melena. Last Colonoscopy done in early 2023, if workup is unremarkable and diarrhea persists, may consider repeat colonoscopy with random colonic biopsies to rule out presence of microscopic colitis.    GERD with abnormal ph impedence and manometry: pH impedence and manometry testing as above, discussion had at that time about referral to [facility], however, it appears that patient and wife wanted to hold off on this and trial on high dose PPI? Patient is currrently on protonix 40mg  BID, taking pepto bismol often for breakthrough symptoms. Failed nexium and omeprazole previously. Will discuss possible referral to [facility] for further evaluation with Dr. Jenetta Downer. For now continue on PPI BID    PLAN:  Check GI pathogen panel, TSH   2. Fecal fat, pancreatic fecal  elastase  3. Consider repeat TCS with biopsies  4. Stay well hydrated, BRAT diet 5. Discuss referral to [facility]  6. Continue PPI BID   All questions were answered, patient verbalized understanding and is in agreement with plan as outlined above.    Follow Up: 1 month  Christian Carrillo L. Alver Sorrow, MSN, APRN, AGNP-C Adult-Gerontology Nurse Practitioner [facility]  I have reviewed the note and agree with the APP's assessment as described in this progress note  Can consider changing PPI BID to [place] for persisting GERD  Maylon Peppers, MD Gastroenterology and [facility]

## 2023-02-22 NOTE — Patient Instructions (Signed)
Continue to stay well hydrated alternating low sugar electrolyte drink and water You can do the (BRAT diet) bananas rice applesauce toast as this is good for diarrhea We will check some labs and stool studies to evaluate diarrhea further For now avoid imodium I will discuss previous abnormal testing at [facility] with Dr. Jenetta Downer  Follow up 1 month

## 2023-02-26 DIAGNOSIS — R194 Change in bowel habit: Secondary | ICD-10-CM | POA: Insufficient documentation

## 2023-02-26 DIAGNOSIS — R197 Diarrhea, unspecified: Secondary | ICD-10-CM | POA: Insufficient documentation

## 2023-02-28 ENCOUNTER — Telehealth: Payer: Self-pay | Admitting: *Deleted

## 2023-02-28 ENCOUNTER — Other Ambulatory Visit (INDEPENDENT_AMBULATORY_CARE_PROVIDER_SITE_OTHER): Payer: Self-pay | Admitting: Gastroenterology

## 2023-02-28 MED ORDER — VOQUEZNA 20 MG PO TABS: 20.0000 mg | ORAL_TABLET | Freq: Every day | ORAL | 0 refills | Status: DC

## 2023-02-28 NOTE — Telephone Encounter (Signed)
Gabriel Rung, NP  Carmelina Noun, LPN Can we let patient know that Dr. Loletha Grayer reviewed his chart regarding previous testing at [facility], for now will hold off on referral back to [facility], can stop his protonix and start [place] for his acid reflux at 20mg  daily x8 weeks then can decrease to 10mg  daily, thereafter. I will place order for the 20mg  dosing, he can let us know once he is getting close to the 8 week mark and we can send the 10mg  dosing at that time  Discussed with patient's wife and she verbalized understanding.

## 2023-02-28 NOTE — Telephone Encounter (Signed)
Ann, please send copy of tsh to Dr. Manuella Ghazi. Thanks

## 2023-03-01 ENCOUNTER — Other Ambulatory Visit: Payer: Self-pay | Admitting: *Deleted

## 2023-03-01 MED ORDER — VOQUEZNA 20 MG PO TABS: 20.0000 mg | ORAL_TABLET | Freq: Every day | ORAL | 0 refills | Status: DC

## 2023-03-01 NOTE — Telephone Encounter (Signed)
Prior auth came in for Christian Carrillo. I canceled rx at [facility] and resent to [facility] since they will do auth and sign patient up for free med if his insurance denies. It. I left samples 2 boxes ( #10 tablets ) at front window for pt to pick up so he can get started on med. He picked up today. He will need to subtract 10 days from his rx when he receives it so he will only take 20mg  for 8 weeks then drop to 10mg .

## 2023-03-05 NOTE — Progress Notes (Signed)
Discussed with patient per [facility] - Can we let patient know that Dr. Salena Saner reviewed his chart regarding previous testing at [facility], for now will hold off on referral back to [facility], can stop his protonix and start [place] for his acid reflux at 20mg  daily x8 weeks then can decrease to 10mg  daily, thereafter. I will place order for the 20mg  dosing, he can let us know once he is getting close to the 8 week mark and we can send the 10mg  dosing at that time  Patient verbalized understanding. [facility]na [facility] and sent to [facility] where they will do pt assist if insurance denies med. Await response from [facility].

## 2023-03-05 NOTE — Telephone Encounter (Signed)
Crystal did pa through cover my meds

## 2023-03-05 NOTE — Telephone Encounter (Signed)
Lab report faxed to PCP ? ?

## 2023-03-09 ENCOUNTER — Telehealth (INDEPENDENT_AMBULATORY_CARE_PROVIDER_SITE_OTHER): Payer: Self-pay

## 2023-03-09 NOTE — Telephone Encounter (Signed)
Patient wife Tammy made aware they have # 60 tablets, and the patient only needs to take #56, then he will decrease to 1/2 tablet (10 mg) daily. She is aware he will have #4 left over and she can cut these in half and we will be working on an authorization for the 10 mg. Patient wife did mention that patient was sick on his stomach last night and had some reflux and had to take pepto. I advised if this continued they would need to notify us. Patient wife states understanding.

## 2023-03-09 NOTE — Telephone Encounter (Signed)
Addendum I spoke with Tiffany at [facility] she says they were awaiting the PA. I cancelled the 20 mg with her.

## 2023-03-09 NOTE — Telephone Encounter (Signed)
Patient came by the office on 03/08/2023 and wanted more samples of Voquezna 20 mg I gave him # 89 and Toniann Fail had given him # 10 prior to this. I have called [facility] and cancelled th Rx for the 20 mg as the patient will have enough to take for the eight weeks. Patient has a total of #60 tablets. And was supposed to take one per day for 8 weeks which would be # 56. Patient would have # 4 left over and can cut those in half to start the maintenance dose of 10 mg once per day.

## 2023-03-14 ENCOUNTER — Other Ambulatory Visit (INDEPENDENT_AMBULATORY_CARE_PROVIDER_SITE_OTHER): Payer: Self-pay

## 2023-03-14 DIAGNOSIS — K219 Gastro-esophageal reflux disease without esophagitis: Secondary | ICD-10-CM

## 2023-03-14 DIAGNOSIS — K224 Dyskinesia of esophagus: Secondary | ICD-10-CM

## 2023-03-14 LAB — GASTROINTESTINAL PATHOGEN PNL
CampyloBacter Group: NOT DETECTED
Norovirus GI/GII: NOT DETECTED
Rotavirus A: NOT DETECTED
Salmonella species: NOT DETECTED
Shiga Toxin 1: NOT DETECTED
Shiga Toxin 2: NOT DETECTED
Shigella Species: NOT DETECTED
Vibrio Group: NOT DETECTED
Yersinia enterocolitica: NOT DETECTED

## 2023-03-14 LAB — FECAL FAT, QUALITATIVE: FECAL FAT, QUALITATIVE: NORMAL

## 2023-03-14 LAB — PANCREATIC ELASTASE, FECAL: Pancreatic Elastase-1, Stool: 446 mcg/g

## 2023-03-14 MED ORDER — VOQUEZNA 10 MG PO TABS
10.0000 mg | ORAL_TABLET | Freq: Every day | ORAL | 5 refills | Status: DC
Start: 2023-03-14 — End: 2023-03-29

## 2023-03-14 NOTE — Telephone Encounter (Signed)
Voquezna 10 mg rx sent to [facility], Patient has enough of the 20 mg samples to take for 56 days. Sent the 10 mg prescription to [facility] so they can get started with the PA.

## 2023-03-14 NOTE — Telephone Encounter (Signed)
Voquezna 10 mg rx sent to [facility], Patient has enough of the 20 mg samples to take for 56 days. Sent the 10 mg prescription to [facility] so they can get started with the PA. 

## 2023-03-16 NOTE — Telephone Encounter (Signed)
I spoke with patient wife she was made aware I have started the process on the Voquezna 10 mg once per day after finishing the 56 days of the 20 mg. Made aware they may be calling to set up shipment.

## 2023-03-16 NOTE — Telephone Encounter (Signed)
I called [facility] spoke with Lanora Manis she says they sent to the claim to insurance probably going to need a PA. No Pa sent to the office.

## 2023-03-21 ENCOUNTER — Encounter (INDEPENDENT_AMBULATORY_CARE_PROVIDER_SITE_OTHER): Payer: Self-pay

## 2023-03-21 NOTE — Telephone Encounter (Signed)
Christian Carrillo to call the field rep for [facility] to see if they can find out what the issue is she will call me back with suggestions.

## 2023-03-21 NOTE — Telephone Encounter (Signed)
I spoke with Christian Carrillo's asked if she could help with this patients authorization for the 10 mgs as the PA I looked up was for the 20 mgs.

## 2023-03-21 NOTE — Telephone Encounter (Signed)
I talked with Tiffany at [facility] she says they needed an authorization. I advised that we have not gotten a request. She says they faxed to [phone]. I advised this was not our fax number. I gave her the correct fax number and she says she would fax it to [place] at the correct # . KEY [id number]

## 2023-03-23 NOTE — Telephone Encounter (Signed)
I called and left a message with Victorino Dike at [facility] asked that she find out the issues with this patients medication and please return call to the office.

## 2023-03-27 NOTE — Telephone Encounter (Signed)
I did authorization on patient 03/27/2023 submitted to insurance.

## 2023-03-28 NOTE — Telephone Encounter (Signed)
Per Patient insurance they will not cover the Voquezna 10 mg. Patient has enough samples of the 20 mg to do the 56 day treatment, but will have to start on a different ppi one of the following below. Please advise.. (I spoke with Victorino Dike with [facility] and she says the patient does not qualify for free medication as he is a Medicare patient).  Your request has been denied Denied. We denied this request under Medicare Part D because VOQUEZNA has not met Non-Formulary Exception criteria requirements. Based on the information provided by your doctor, at least three alternative formulary medications in the same drug class or category (including the generic equivalent, if available) have not been tried. In this case, only two of the alternative medications have been tried: Omeprazole and Pantoprazole. Alternative medications include (please refer to your plan formulary): Esomeprazole, Lansoprazole, Rabeprazole, etc.

## 2023-03-28 NOTE — Telephone Encounter (Signed)
Please send esomeprazole 40 mg twice a day to his pharmacy and let him know his insurance will not cover Voquezna any more. Needs to start esomeprazole once he runs out of Voquezna

## 2023-03-29 ENCOUNTER — Ambulatory Visit (INDEPENDENT_AMBULATORY_CARE_PROVIDER_SITE_OTHER): Payer: Medicare Other | Admitting: Gastroenterology

## 2023-03-29 ENCOUNTER — Encounter (INDEPENDENT_AMBULATORY_CARE_PROVIDER_SITE_OTHER): Payer: Self-pay | Admitting: Gastroenterology

## 2023-03-29 VITALS — BP 160/70 | HR 68 | Temp 97.9°F | Ht 72.0 in | Wt 206.9 lb

## 2023-03-29 DIAGNOSIS — R1310 Dysphagia, unspecified: Secondary | ICD-10-CM | POA: Diagnosis not present

## 2023-03-29 DIAGNOSIS — R197 Diarrhea, unspecified: Secondary | ICD-10-CM | POA: Diagnosis not present

## 2023-03-29 DIAGNOSIS — K224 Dyskinesia of esophagus: Secondary | ICD-10-CM

## 2023-03-29 DIAGNOSIS — K219 Gastro-esophageal reflux disease without esophagitis: Secondary | ICD-10-CM | POA: Diagnosis not present

## 2023-03-29 MED ORDER — ESOMEPRAZOLE MAGNESIUM 40 MG PO CPDR
40.0000 mg | DELAYED_RELEASE_CAPSULE | Freq: Two times a day (BID) | ORAL | 1 refills | Status: DC
Start: 2023-03-29 — End: 2023-07-16

## 2023-03-29 NOTE — H&P (View-Only) (Signed)
Christian Carrillo, M.D. Gastroenterology & Hepatology [facility] [address] [place], [place] [address]  Primary Care Physician: Shah, Ashish, MD [address] [place] [address]  I will communicate my assessment and recommendations to the referring MD via EMR.  Problems: GERD Jackhammer esophagus Acute diarrhea, resolved  History of Present Illness: Christian Carrillo is a 71 y.o. male with past medical history of COPD, depression, GERD, hypertension and jackhammer esophagus, who presents for follow up of GERD and diarrhea.  The patient was last seen on 02/22/2023. At that time, the patient was complaining of diarrhea for 3 weeks -GI pathogen panel, TSH, fecal fat and pancreatic elastase were checked, TSH was slightly elevated at 5.72 but rest of testing was within normal limits..  Patient was continued on Protonix 40 mg twice daily for GERD, given persistent symptoms he was eventually switched to Voquezna.  Patient reports that he has presented some improvement of his diarrhea. Reports having diarrhea when he eats a large amount of food. He is having watery bowel movements x1 in large amount. Has had a few episodes of fecal soiling due to urgency. Patient already scheduled for colonoscopy on 5/17.  States his heartburn feels somewhat better. Feels he is still having regurgitation but no frequent heartburn (possibly every 1-2 weeks) or dysphagia. Still taking Voquezna on a daily basis .Our office was reached yesterday by his insurance stating that he will not have any more coverage for  voquezna - needs to fail 3 PPIs so this can be approved.  Sent a prescription for esomeprazole 40 mg twice a day to his pharmacy .  He is taking diltiazem 60 mg twice a day for jackhammer esophagus. Not presenting chest pain episodes. He chews food thoroughly so he does not have issues swallowing, but still having some dysphagia if he does not do that.   The patient  denies having any nausea, vomiting, fever, chills, hematochezia, melena, hematemesis, abdominal distention, abdominal pain, jaundice, pruritus or weight loss.  Last Colonoscopy:11/2021  - One 8 mm polyp in the transverse colon, removed                            with a cold snare. Resected and retrieved.                           - Diverticulosis in the transverse colon.                           - Non-bleeding internal hemorrhoids. Last Endoscopy:2021 - Normal hypopharynx.                           - Benign-appearing esophageal stenosis. Dilated.                           - Z-line irregular, 41 cm from the incisors.                           - 2 cm hiatal hernia.                           - Gastritis. Biopsied.                           -   Normal duodenal bulb and second portion of the                            duodenum.  Past Medical History: Past Medical History:  Diagnosis Date   Anxiety    Arthritis    Chronic back pain    COPD (chronic obstructive pulmonary disease) (HCC)    Depression    GERD (gastroesophageal reflux disease)    Hepatitis    Hypertension    Localized edema    Other emphysema (HCC)    Shortness of breath dyspnea     Past Surgical History: Past Surgical History:  Procedure Laterality Date   BACK SURGERY     BIOPSY  02/18/2020   Procedure: BIOPSY;  Surgeon: Rehman, Najeeb U, MD;  [facility];  Service: Endoscopy;;  gastric   cataract surgery     x 1 eye   COLONOSCOPY WITH PROPOFOL N/A 12/07/2021   Procedure: COLONOSCOPY WITH PROPOFOL;  Surgeon: Carrillo Mayorga, Sorin Frimpong, MD;  [facility];  Service: Gastroenterology;  Laterality: N/A;  9:45   ESOPHAGEAL DILATION N/A 02/18/2020   Procedure: ESOPHAGEAL DILATION;  Surgeon: Rehman, Najeeb U, MD;  [facility];  Service: Endoscopy;  Laterality: N/A;   ESOPHAGOGASTRODUODENOSCOPY N/A 05/03/2016   Procedure: ESOPHAGOGASTRODUODENOSCOPY (EGD);  Surgeon: Najeeb U Rehman, MD;  [facility];  Service: Endoscopy;  Laterality: N/A;  1:00 - moved to 6/7 @ 7:30 - Ann notified pt   ESOPHAGOGASTRODUODENOSCOPY N/A 02/18/2020   Procedure: ESOPHAGOGASTRODUODENOSCOPY (EGD);  Surgeon: Rehman, Najeeb U, MD;  [facility];  Service: Endoscopy;  Laterality: N/A;  240   POLYPECTOMY  12/07/2021   Procedure: POLYPECTOMY;  Surgeon: Carrillo Mayorga, Teancum Brule, MD;  [facility];  Service: Gastroenterology;;   rt femur fx     traction   rt shoulder surgery pinning.       Family History: Family History  Problem Relation Age of Onset   Obesity Mother    Hypothyroidism Mother    Diabetes Mellitus II Mother    Cancer Father    Heart attack Brother    Diabetes Mellitus II Brother     Social History: Social History   Tobacco Use  Smoking Status Every Day   Packs/day: 1.00   Years: 30.00   Additional pack years: 0.00   Total pack years: 30.00   Types: Cigarettes   Last attempt to quit: 03/06/2016   Years since quitting: 7.0  Smokeless Tobacco Never   Social History   Substance and Sexual Activity  Alcohol Use No   Alcohol/week: 0.0 standard drinks of alcohol   Social History   Substance and Sexual Activity  Drug Use No    Allergies: Allergies  Allergen Reactions   Codeine     itching    Medications: Current Outpatient Medications  Medication Sig Dispense Refill   amitriptyline (ELAVIL) 25 MG tablet Take 25 mg by mouth at bedtime.     chlorthalidone (HYGROTON) 25 MG tablet Take 25 mg by mouth daily.     ciprofloxacin (CIPRO) 500 MG tablet Take 500 mg by mouth 2 (two) times daily.     cloNIDine (CATAPRES) 0.1 MG tablet Take 0.1 mg by mouth 2 (two) times daily.     diltiazem (CARDIZEM) 60 MG tablet TAKE 1 TABLET(60 MG) BY MOUTH EVERY 8 HOURS AS NEEDED FOR CHEST DISCOMFORT 90 tablet 3   FLUoxetine (PROZAC) 20 MG capsule Take   20 mg by mouth daily.   0   losartan (COZAAR) 100 MG tablet TAKE 1 TABLET(100 MG) BY MOUTH DAILY 90 tablet 3    potassium chloride (KLOR-CON) 10 MEQ tablet Take 10 mEq by mouth daily.     pravastatin (PRAVACHOL) 40 MG tablet Take 1 tablet (40 mg total) by mouth daily. Please schedule annual appt for refills. [phone]. 1st attempt. (Patient taking differently: Take 40 mg by mouth at bedtime.) 30 tablet 0   tiZANidine (ZANAFLEX) 4 MG tablet Take 4 mg by mouth 2 (two) times daily.     Vonoprazan Fumarate (VOQUEZNA) 10 MG TABS Take 10 mg by mouth daily at 6 (six) AM. 30 tablet 5   No current facility-administered medications for this visit.    Review of Systems: GENERAL: negative for malaise, night sweats HEENT: No changes in hearing or vision, no nose bleeds or other nasal problems. NECK: Negative for lumps, goiter, pain and significant neck swelling RESPIRATORY: Negative for cough, wheezing CARDIOVASCULAR: Negative for chest pain, leg swelling, palpitations, orthopnea GI: SEE HPI MUSCULOSKELETAL: Negative for joint pain or swelling, back pain, and muscle pain. SKIN: Negative for lesions, rash PSYCH: Negative for sleep disturbance, mood disorder and recent psychosocial stressors. HEMATOLOGY Negative for prolonged bleeding, bruising easily, and swollen nodes. ENDOCRINE: Negative for cold or heat intolerance, polyuria, polydipsia and goiter. NEURO: negative for tremor, gait imbalance, syncope and seizures. The remainder of the review of systems is noncontributory.   Physical Exam: BP (!) 160/70   Pulse 68   Temp 97.9 F (36.6 C)   Ht 6' (1.829 m)   Wt 206 lb 14.4 oz (93.8 kg)   BMI 28.06 kg/m  GENERAL: The patient is AO x3, in no acute distress. HEENT: Head is normocephalic and atraumatic. EOMI are intact. Mouth is well hydrated and without lesions. NECK: Supple. No masses LUNGS: Clear to auscultation. No presence of rhonchi/wheezing/rales. Adequate chest expansion HEART: RRR, normal s1 and s2. ABDOMEN: Soft, nontender, no guarding, no peritoneal signs, and nondistended. BS +. No  masses. EXTREMITIES: Without any cyanosis, clubbing, rash, lesions or edema. NEUROLOGIC: AOx3, no focal motor deficit. SKIN: no jaundice, no rashes  Imaging/Labs: as above  I personally reviewed and interpreted the available labs, imaging and endoscopic files.  Impression and Plan: Christian Carrillo is a 71 y.o. male with past medical history of COPD, depression, GERD, hypertension and jackhammer esophagus, who presents for follow up of GERD and diarrhea.  The patient has presented some improvement of his diarrhea with the use of Imodium as needed, having less frequent episodes.  We discussed it would be reasonable to check other etiologies such as celiac disease or alpha gal with serologies today.  Also discussed that proceeding a colonoscopy will make colonic biopsies as already scheduled will be adequate.  Patient understood and agreed.  For now, he can continue with Imodium as needed for diarrhea episodes.  He has presented improvement of his GERD while on vonoprazan.  Unfortunately, his insurance may not cover the 10 mg dosing until he has failed 3 different PPIs.  Due to this, we will switch him to Nexium 40 mg twice daily.  Will explore his symptoms further with an EGD with possible repeat dilation given recurrent dysphagia.  As he has had control of his jackhammer esophagus with his current dose of diltiazem, no adjustments will be performed for this medication.  - Proceed with scheduled colonoscopy with random colonic biopsies, will add EGD with ED  - Finish course of Voquezna   20 mg qday and start Nexium 40 mg BID once done with Voquezna - Continue Imodium as needed for diarrhea -Continue diltiazem 60 mg twice daily. -Check alpha gal panel and celiac disease panel  All questions were answered.      Christian Rupnow Castaneda, MD Gastroenterology and Hepatology [facility]  

## 2023-03-29 NOTE — Progress Notes (Signed)
Christian Carrillo, M.D. Gastroenterology & Hepatology [facility] [address] [place], [place] [address]  Primary Care Physician: Kirstie Peri, MD [address] [place] [address]  I will communicate my assessment and recommendations to the referring MD via EMR.  Problems: GERD Jackhammer esophagus Acute diarrhea, resolved  History of Present Illness: Christian Carrillo is a 72 y.o. male with past medical history of COPD, depression, GERD, hypertension and jackhammer esophagus, who presents for follow up of GERD and diarrhea.  The patient was last seen on 02/22/2023. At that time, the patient was complaining of diarrhea for 3 weeks -GI pathogen panel, TSH, fecal fat and pancreatic elastase were checked, TSH was slightly elevated at 5.72 but rest of testing was within normal limits..  Patient was continued on Protonix 40 mg twice daily for GERD, given persistent symptoms he was eventually switched to Voquezna.  Patient reports that he has presented some improvement of his diarrhea. Reports having diarrhea when he eats a large amount of food. He is having watery bowel movements x1 in large amount. Has had a few episodes of fecal soiling due to urgency. Patient already scheduled for colonoscopy on 5/17.  States his heartburn feels somewhat better. Feels he is still having regurgitation but no frequent heartburn (possibly every 1-2 weeks) or dysphagia. Still taking Voquezna on a daily basis .Our office was reached yesterday by his insurance stating that he will not have any more coverage for  voquezna - needs to fail 3 PPIs so this can be approved.  Sent a prescription for esomeprazole 40 mg twice a day to his pharmacy .  He is taking diltiazem 60 mg twice a day for jackhammer esophagus. Not presenting chest pain episodes. He chews food thoroughly so he does not have issues swallowing, but still having some dysphagia if he does not do that.   The patient  denies having any nausea, vomiting, fever, chills, hematochezia, melena, hematemesis, abdominal distention, abdominal pain, jaundice, pruritus or weight loss.  Last Colonoscopy:11/2021  - One 8 mm polyp in the transverse colon, removed                            with a cold snare. Resected and retrieved.                           - Diverticulosis in the transverse colon.                           - Non-bleeding internal hemorrhoids. Last Endoscopy:2021 - Normal hypopharynx.                           - Benign-appearing esophageal stenosis. Dilated.                           - Z-line irregular, 41 cm from the incisors.                           - 2 cm hiatal hernia.                           - Gastritis. Biopsied.                           -  Normal duodenal bulb and second portion of the                            duodenum.  Past Medical History: Past Medical History:  Diagnosis Date   Anxiety    Arthritis    Chronic back pain    COPD (chronic obstructive pulmonary disease) (HCC)    Depression    GERD (gastroesophageal reflux disease)    Hepatitis    Hypertension    Localized edema    Other emphysema (HCC)    Shortness of breath dyspnea     Past Surgical History: Past Surgical History:  Procedure Laterality Date   BACK SURGERY     BIOPSY  02/18/2020   Procedure: BIOPSY;  Surgeon: Malissa Hippo, MD;  [facility];  Service: Endoscopy;;  gastric   cataract surgery     x 1 eye   COLONOSCOPY WITH PROPOFOL N/A 12/07/2021   Procedure: COLONOSCOPY WITH PROPOFOL;  Surgeon: Dolores Frame, MD;  [facility];  Service: Gastroenterology;  Laterality: N/A;  9:45   ESOPHAGEAL DILATION N/A 02/18/2020   Procedure: ESOPHAGEAL DILATION;  Surgeon: Malissa Hippo, MD;  [facility];  Service: Endoscopy;  Laterality: N/A;   ESOPHAGOGASTRODUODENOSCOPY N/A 05/03/2016   Procedure: ESOPHAGOGASTRODUODENOSCOPY (EGD);  Surgeon: Malissa Hippo, MD;  [facility];  Service: Endoscopy;  Laterality: N/A;  1:00 - moved to 6/7 @ 7:30 - Ann notified pt   ESOPHAGOGASTRODUODENOSCOPY N/A 02/18/2020   Procedure: ESOPHAGOGASTRODUODENOSCOPY (EGD);  Surgeon: Malissa Hippo, MD;  [facility];  Service: Endoscopy;  Laterality: N/A;  240   POLYPECTOMY  12/07/2021   Procedure: POLYPECTOMY;  Surgeon: Marguerita Merles, Reuel Boom, MD;  [facility];  Service: Gastroenterology;;   rt femur fx     traction   rt shoulder surgery pinning.       Family History: Family History  Problem Relation Age of Onset   Obesity Mother    Hypothyroidism Mother    Diabetes Mellitus II Mother    Cancer Father    Heart attack Brother    Diabetes Mellitus II Brother     Social History: Social History   Tobacco Use  Smoking Status Every Day   Packs/day: 1.00   Years: 30.00   Additional pack years: 0.00   Total pack years: 30.00   Types: Cigarettes   Last attempt to quit: 03/06/2016   Years since quitting: 7.0  Smokeless Tobacco Never   Social History   Substance and Sexual Activity  Alcohol Use No   Alcohol/week: 0.0 standard drinks of alcohol   Social History   Substance and Sexual Activity  Drug Use No    Allergies: Allergies  Allergen Reactions   Codeine     itching    Medications: Current Outpatient Medications  Medication Sig Dispense Refill   amitriptyline (ELAVIL) 25 MG tablet Take 25 mg by mouth at bedtime.     chlorthalidone (HYGROTON) 25 MG tablet Take 25 mg by mouth daily.     ciprofloxacin (CIPRO) 500 MG tablet Take 500 mg by mouth 2 (two) times daily.     cloNIDine (CATAPRES) 0.1 MG tablet Take 0.1 mg by mouth 2 (two) times daily.     diltiazem (CARDIZEM) 60 MG tablet TAKE 1 TABLET(60 MG) BY MOUTH EVERY 8 HOURS AS NEEDED FOR CHEST DISCOMFORT 90 tablet 3   FLUoxetine (PROZAC) 20 MG capsule Take  20 mg by mouth daily.   0   losartan (COZAAR) 100 MG tablet TAKE 1 TABLET(100 MG) BY MOUTH DAILY 90 tablet 3    potassium chloride (KLOR-CON) 10 MEQ tablet Take 10 mEq by mouth daily.     pravastatin (PRAVACHOL) 40 MG tablet Take 1 tablet (40 mg total) by mouth daily. Please schedule annual appt for refills. [phone]. 1st attempt. (Patient taking differently: Take 40 mg by mouth at bedtime.) 30 tablet 0   tiZANidine (ZANAFLEX) 4 MG tablet Take 4 mg by mouth 2 (two) times daily.     Vonoprazan Fumarate (VOQUEZNA) 10 MG TABS Take 10 mg by mouth daily at 6 (six) AM. 30 tablet 5   No current facility-administered medications for this visit.    Review of Systems: GENERAL: negative for malaise, night sweats HEENT: No changes in hearing or vision, no nose bleeds or other nasal problems. NECK: Negative for lumps, goiter, pain and significant neck swelling RESPIRATORY: Negative for cough, wheezing CARDIOVASCULAR: Negative for chest pain, leg swelling, palpitations, orthopnea GI: SEE HPI MUSCULOSKELETAL: Negative for joint pain or swelling, back pain, and muscle pain. SKIN: Negative for lesions, rash PSYCH: Negative for sleep disturbance, mood disorder and recent psychosocial stressors. HEMATOLOGY Negative for prolonged bleeding, bruising easily, and swollen nodes. ENDOCRINE: Negative for cold or heat intolerance, polyuria, polydipsia and goiter. NEURO: negative for tremor, gait imbalance, syncope and seizures. The remainder of the review of systems is noncontributory.   Physical Exam: BP (!) 160/70   Pulse 68   Temp 97.9 F (36.6 C)   Ht 6' (1.829 m)   Wt 206 lb 14.4 oz (93.8 kg)   BMI 28.06 kg/m  GENERAL: The patient is AO x3, in no acute distress. HEENT: Head is normocephalic and atraumatic. EOMI are intact. Mouth is well hydrated and without lesions. NECK: Supple. No masses LUNGS: Clear to auscultation. No presence of rhonchi/wheezing/rales. Adequate chest expansion HEART: RRR, normal s1 and s2. ABDOMEN: Soft, nontender, no guarding, no peritoneal signs, and nondistended. BS +. No  masses. EXTREMITIES: Without any cyanosis, clubbing, rash, lesions or edema. NEUROLOGIC: AOx3, no focal motor deficit. SKIN: no jaundice, no rashes  Imaging/Labs: as above  I personally reviewed and interpreted the available labs, imaging and endoscopic files.  Impression and Plan: DAIMION ADAMCIK is a 72 y.o. male with past medical history of COPD, depression, GERD, hypertension and jackhammer esophagus, who presents for follow up of GERD and diarrhea.  The patient has presented some improvement of his diarrhea with the use of Imodium as needed, having less frequent episodes.  We discussed it would be reasonable to check other etiologies such as celiac disease or alpha gal with serologies today.  Also discussed that proceeding a colonoscopy will make colonic biopsies as already scheduled will be adequate.  Patient understood and agreed.  For now, he can continue with Imodium as needed for diarrhea episodes.  He has presented improvement of his GERD while on vonoprazan.  Unfortunately, his insurance may not cover the 10 mg dosing until he has failed 3 different PPIs.  Due to this, we will switch him to Nexium 40 mg twice daily.  Will explore his symptoms further with an EGD with possible repeat dilation given recurrent dysphagia.  As he has had control of his jackhammer esophagus with his current dose of diltiazem, no adjustments will be performed for this medication.  - Proceed with scheduled colonoscopy with random colonic biopsies, will add EGD with ED  - Doreatha Martin course of Voquezna  20 mg qday and start Nexium 40 mg BID once done with Voquezna - Continue Imodium as needed for diarrhea -Continue diltiazem 60 mg twice daily. -Check alpha gal panel and celiac disease panel  All questions were answered.      Christian Blazing, MD Gastroenterology and Hepatology [facility]

## 2023-03-29 NOTE — Patient Instructions (Addendum)
Proceed with scheduled colonoscopy with random colonic biopsies, will add EGD with ED  Finish course of Voquezna 20 mg qday and start Nexium 40 mg BID once you have run out of Voquezna Continue Imodium as needed for diarrhea Perform blood workup

## 2023-03-30 LAB — INTERPRETATION:

## 2023-03-30 LAB — ALPHA-GAL PANEL: Beef: <0.1 kU/L

## 2023-03-30 NOTE — Telephone Encounter (Signed)
Per Toniann Fail patient was seen in the office by Dr. Levon Hedger on 03/29/2023 and was told to finish out the Voquezna 20 mg and then when this is finished Start on Esomeprazole.

## 2023-04-02 ENCOUNTER — Telehealth (INDEPENDENT_AMBULATORY_CARE_PROVIDER_SITE_OTHER): Payer: Self-pay

## 2023-04-02 LAB — CELIAC DISEASE PANEL: (tTG) Ab, IgG: <1 U/mL

## 2023-04-02 LAB — ALPHA-GAL PANEL: Allergen, Pork, f26: <0.1 kU/L

## 2023-04-02 NOTE — Telephone Encounter (Signed)
Approved for Esomeprazole per [employer] from 03/30/2023-03/29/2024

## 2023-04-06 NOTE — Telephone Encounter (Signed)
Changed to nexium on visit on 03/28/22.

## 2023-04-07 LAB — CELIAC DISEASE PANEL
(tTG) Ab, IgA: <1 U/mL
Gliadin IgA: 1.3 U/mL
Gliadin IgG: <1 U/mL
Immunoglobulin A: 119 mg/dL (ref 70–320)

## 2023-04-07 LAB — ALPHA-GAL PANEL
Allergen, Mutton, f88: <0.1 kU/L
CLASS: 0
CLASS: 0
Class: 0
GALACTOSE-ALPHA-1,3-GALACTOSE IGE*: <0.1 kU/L (ref <0.10)

## 2023-04-13 ENCOUNTER — Encounter (HOSPITAL_COMMUNITY): Payer: Self-pay | Admitting: Gastroenterology

## 2023-04-13 ENCOUNTER — Ambulatory Visit (HOSPITAL_BASED_OUTPATIENT_CLINIC_OR_DEPARTMENT_OTHER): Payer: Medicare Other | Admitting: Anesthesiology

## 2023-04-13 ENCOUNTER — Encounter (HOSPITAL_COMMUNITY)
Admission: RE | Disposition: A | Discharge status: Home / Self Care | Payer: Self-pay | Source: Home / Self Care | Attending: Gastroenterology

## 2023-04-13 ENCOUNTER — Ambulatory Visit (HOSPITAL_COMMUNITY)
Admission: RE | Admit: 2023-04-13 | Discharge: 2023-04-13 | Disposition: A | Discharge status: Home / Self Care | Payer: Medicare Other | Attending: Gastroenterology | Admitting: Gastroenterology

## 2023-04-13 ENCOUNTER — Ambulatory Visit (HOSPITAL_COMMUNITY): Payer: Medicare Other | Admitting: Anesthesiology

## 2023-04-13 ENCOUNTER — Other Ambulatory Visit: Payer: Self-pay

## 2023-04-13 DIAGNOSIS — R1310 Dysphagia, unspecified: Secondary | ICD-10-CM

## 2023-04-13 DIAGNOSIS — K5730 Diverticulosis of large intestine without perforation or abscess without bleeding: Secondary | ICD-10-CM | POA: Diagnosis not present

## 2023-04-13 DIAGNOSIS — Z8719 Personal history of other diseases of the digestive system: Secondary | ICD-10-CM | POA: Diagnosis not present

## 2023-04-13 DIAGNOSIS — K222 Esophageal obstruction: Secondary | ICD-10-CM | POA: Insufficient documentation

## 2023-04-13 DIAGNOSIS — K219 Gastro-esophageal reflux disease without esophagitis: Secondary | ICD-10-CM | POA: Diagnosis not present

## 2023-04-13 DIAGNOSIS — R194 Change in bowel habit: Secondary | ICD-10-CM

## 2023-04-13 DIAGNOSIS — J449 Chronic obstructive pulmonary disease, unspecified: Secondary | ICD-10-CM

## 2023-04-13 DIAGNOSIS — R197 Diarrhea, unspecified: Secondary | ICD-10-CM

## 2023-04-13 DIAGNOSIS — F17210 Nicotine dependence, cigarettes, uncomplicated: Secondary | ICD-10-CM | POA: Insufficient documentation

## 2023-04-13 DIAGNOSIS — D122 Benign neoplasm of ascending colon: Secondary | ICD-10-CM | POA: Insufficient documentation

## 2023-04-13 DIAGNOSIS — Z79899 Other long term (current) drug therapy: Secondary | ICD-10-CM | POA: Insufficient documentation

## 2023-04-13 DIAGNOSIS — D126 Benign neoplasm of colon, unspecified: Secondary | ICD-10-CM

## 2023-04-13 DIAGNOSIS — I10 Essential (primary) hypertension: Secondary | ICD-10-CM

## 2023-04-13 HISTORY — PX: BIOPSY: SHX5522

## 2023-04-13 HISTORY — PX: POLYPECTOMY: SHX5525

## 2023-04-13 HISTORY — PX: ESOPHAGOGASTRODUODENOSCOPY (EGD) WITH PROPOFOL: SHX5813

## 2023-04-13 HISTORY — PX: COLONOSCOPY WITH PROPOFOL: SHX5780

## 2023-04-13 LAB — HM COLONOSCOPY

## 2023-04-13 SURGERY — ESOPHAGOGASTRODUODENOSCOPY (EGD) WITH PROPOFOL
Anesthesia: Monitor Anesthesia Care

## 2023-04-13 SURGERY — COLONOSCOPY WITH PROPOFOL
Anesthesia: General

## 2023-04-13 MED ORDER — LACTATED RINGERS IV SOLN
INTRAVENOUS | Status: DC
  Administered 2023-04-13: 1000 mL via INTRAVENOUS

## 2023-04-13 MED ORDER — PROPOFOL 10 MG/ML IV BOLUS
INTRAVENOUS | Status: DC | PRN
  Administered 2023-04-13: 80 mg via INTRAVENOUS

## 2023-04-13 MED ORDER — PROPOFOL 500 MG/50ML IV EMUL
INTRAVENOUS | Status: AC
  Filled 2023-04-13: qty 50

## 2023-04-13 MED ORDER — PROPOFOL 500 MG/50ML IV EMUL
INTRAVENOUS | Status: DC | PRN
  Administered 2023-04-13: 180 ug/kg/min via INTRAVENOUS

## 2023-04-13 MED ORDER — LIDOCAINE HCL (PF) 2 % IJ SOLN
INTRAMUSCULAR | Status: AC
  Filled 2023-04-13: qty 5

## 2023-04-13 MED ORDER — LIDOCAINE HCL (PF) 2 % IJ SOLN
INTRAMUSCULAR | Status: DC | PRN
  Administered 2023-04-13: 50 mg via INTRADERMAL

## 2023-04-13 MED ORDER — PHENYLEPHRINE HCL (PRESSORS) 10 MG/ML IV SOLN
INTRAVENOUS | Status: DC | PRN
  Administered 2023-04-13: 80 ug via INTRAVENOUS

## 2023-04-13 NOTE — Anesthesia Preprocedure Evaluation (Addendum)
Anesthesia Evaluation  Patient identified by MRN, date of birth, ID band Patient awake    Reviewed: Allergy & Precautions, H&P , NPO status , Patient's Chart, lab work & pertinent test results  Airway Mallampati: II  TM Distance: >3 FB Neck ROM: Full    Dental  (+) Edentulous Upper, Edentulous Lower   Pulmonary shortness of breath and with exertion, COPD,  COPD inhaler, Current Smoker   Pulmonary exam normal breath sounds clear to auscultation       Cardiovascular hypertension, Pt. on medications Normal cardiovascular exam Rhythm:Regular Rate:Normal     Neuro/Psych  PSYCHIATRIC DISORDERS Anxiety Depression    negative neurological ROS     GI/Hepatic ,GERD  Medicated,,(+)     (-) substance abuse  , Hepatitis -  Endo/Other  negative endocrine ROS    Renal/GU negative Renal ROS  negative genitourinary   Musculoskeletal  (+) Arthritis ,    Abdominal   Peds negative pediatric ROS (+)  Hematology negative hematology ROS (+)   Anesthesia Other Findings Back pain  Reproductive/Obstetrics negative OB ROS                             Anesthesia Physical Anesthesia Plan  ASA: 3  Anesthesia Plan: General   Post-op Pain Management: Minimal or no pain anticipated   Induction: Intravenous  PONV Risk Score and Plan: 1 and Propofol infusion  Airway Management Planned: Nasal Cannula and Natural Airway  Additional Equipment:   Intra-op Plan:   Post-operative Plan:   Informed Consent: I have reviewed the patients History and Physical, chart, labs and discussed the procedure including the risks, benefits and alternatives for the proposed anesthesia with the patient or authorized representative who has indicated his/her understanding and acceptance.     Dental advisory given  Plan Discussed with: CRNA and Surgeon  Anesthesia Plan Comments:         Anesthesia Quick Evaluation

## 2023-04-13 NOTE — Anesthesia Postprocedure Evaluation (Signed)
Anesthesia Post Note  Patient: AMJAD ZUPAN  Procedure(s) Performed: COLONOSCOPY WITH PROPOFOL ESOPHAGOGASTRODUODENOSCOPY (EGD) WITH PROPOFOL BIOPSY POLYPECTOMY  Patient location during evaluation: Phase II Anesthesia Type: General Level of consciousness: awake and alert and oriented Pain management: pain level controlled Vital Signs Assessment: post-procedure vital signs reviewed and stable Respiratory status: spontaneous breathing, nonlabored ventilation and respiratory function stable Cardiovascular status: blood pressure returned to baseline and stable Postop Assessment: no apparent nausea or vomiting Anesthetic complications: no  No notable events documented.   Last Vitals:  Vitals:   04/13/23 0825 04/13/23 0957  BP: (!) 151/73 (!) 112/45  Pulse: 67 (!) 58  Resp: 18 (!) 22  Temp: 36.8 C 36.5 C  SpO2: 97% 93%    Last Pain:  Vitals:   04/13/23 0957  TempSrc: Oral  PainSc: 0-No pain                 Shahzad Thomann C Chaim Gatley

## 2023-04-13 NOTE — Discharge Instructions (Addendum)
You are being discharged to home.  Resume your previous diet.  Continue your present medications.  We are waiting for your pathology results.  Your physician has recommended a repeat colonoscopy in five years for surveillance.  

## 2023-04-13 NOTE — Transfer of Care (Signed)
Immediate Anesthesia Transfer of Care Note  Patient: Christian Carrillo  Procedure(s) Performed: COLONOSCOPY WITH PROPOFOL ESOPHAGOGASTRODUODENOSCOPY (EGD) WITH PROPOFOL BIOPSY POLYPECTOMY  Patient Location: Endoscopy Unit  Anesthesia Type:General  Level of Consciousness: awake, alert , and oriented  Airway & Oxygen Therapy: Patient Spontanous Breathing  Post-op Assessment: Report given to RN, Post -op Vital signs reviewed and stable, Patient moving all extremities X 4, and Patient able to stick tongue midline  Post vital signs: Reviewed  Last Vitals:  Vitals Value Taken Time  BP 112/45 04/13/23 0957  Temp 36.5 C 04/13/23 0957  Pulse 58 04/13/23 0957  Resp 22 04/13/23 0957  SpO2 93 % 04/13/23 0957    Last Pain:  Vitals:   04/13/23 0957  TempSrc: Oral  PainSc:       Patients Stated Pain Goal: 8 (04/13/23 0825)  Complications: No notable events documented.

## 2023-04-13 NOTE — Interval H&P Note (Signed)
History and Physical Interval Note:  04/13/2023 8:21 AM  Christian Carrillo  has presented today for surgery, with the diagnosis of Change in bowel habits, diarrhea.  The various methods of treatment have been discussed with the patient and family. After consideration of risks, benefits and other options for treatment, the patient has consented to  Procedure(s) with comments: COLONOSCOPY WITH PROPOFOL (N/A) - 9:45am;ASA 2 ESOPHAGOGASTRODUODENOSCOPY (EGD) WITH PROPOFOL (N/A) as a surgical intervention.  The patient's history has been reviewed, patient examined, no change in status, stable for surgery.  I have reviewed the patient's chart and labs.  Questions were answered to the patient's satisfaction.     Katrinka Blazing Mayorga

## 2023-04-13 NOTE — Op Note (Signed)
[facility] Patient Name: Christian Carrillo Procedure Date: 04/13/2023 9:21 AM MRN: [id number] Date of Birth: 1951/08/16 Attending MD: Katrinka Blazing , , [id number] CSN: [id number] Age: 72 Admit Type: Outpatient Procedure:                Colonoscopy Indications:              Clinically significant diarrhea of unexplained                            origin Providers:                Katrinka Blazing, Sheran Fava, Kristine L.                            Jessee Avers, Technician Referring MD:              Medicines:                Monitored Anesthesia Care Complications:            No immediate complications. Estimated Blood Loss:     Estimated blood loss: none. Procedure:                Pre-Anesthesia Assessment:                           - Prior to the procedure, a History and Physical                            was performed, and patient medications, allergies                            and sensitivities were reviewed. The patient's                            tolerance of previous anesthesia was reviewed.                           - The risks and benefits of the procedure and the                            sedation options and risks were discussed with the                            patient. All questions were answered and informed                            consent was obtained.                           - ASA Grade Assessment: II - A patient with mild                            systemic disease.                           After obtaining informed consent, the colonoscope  was passed under direct vision. Throughout the                            procedure, the patient's blood pressure, pulse, and                            oxygen saturations were monitored continuously. The                            [id number]-HQ190L ([id number]) scope was introduced through                            the anus and advanced to the the cecum, identified                             by appendiceal orifice and ileocecal valve. The                            colonoscopy was performed without difficulty. The                            patient tolerated the procedure well. The quality                            of the bowel preparation was adequate. Scope In: 9:25:08 AM Scope Out: 9:52:45 AM Scope Withdrawal Time: 0 hours 21 minutes 27 seconds  Total Procedure Duration: 0 hours 27 minutes 37 seconds  Findings:      The perianal and digital rectal examinations were normal.      Three sessile polyps were found in the ascending colon. The polyps were       1 to 6 mm in size. These polyps were removed with a cold snare.       Resection and retrieval were complete.      Scattered medium-mouthed and small-mouthed diverticula were found in the       sigmoid colon and descending colon. Biopsies for histology were taken       with a cold forceps from the right colon and left colon for evaluation       of microscopic colitis.      The retroflexed view of the distal rectum and anal verge was normal and       showed no anal or rectal abnormalities. Impression:               - Three 1 to 6 mm polyps in the ascending colon,                            removed with a cold snare. Resected and retrieved.                           - Diverticulosis in the sigmoid colon and in the                            descending colon. Biopsied.                           -  The distal rectum and anal verge are normal on                            retroflexion view. Moderate Sedation:      Per Anesthesia Care Recommendation:           - Discharge patient to home (ambulatory).                           - Resume previous diet.                           - Await pathology results.                           - Repeat colonoscopy in 5 years for surveillance. Procedure Code(s):        --- Professional ---                           [phone], Colonoscopy, flexible; with removal of                             tumor(s), polyp(s), or other lesion(s) by snare                            technique                           45380, 59, Colonoscopy, flexible; with biopsy,                            single or multiple Diagnosis Code(s):        --- Professional ---                           D12.2, Benign neoplasm of ascending colon                           R19.7, Diarrhea, unspecified                           K57.30, Diverticulosis of large intestine without                            perforation or abscess without bleeding CPT copyright 2022 American Medical Association. All rights reserved. The codes documented in this report are preliminary and upon coder review may  be revised to meet current compliance requirements. Katrinka Blazing, MD Katrinka Blazing,  04/13/2023 10:01:18 AM This report has been signed electronically. Number of Addenda: 0

## 2023-04-13 NOTE — Anesthesia Procedure Notes (Signed)
Procedure Name: General with mask airway Date/Time: 04/13/2023 9:07 AM  Performed by: Cy Blamer, CRNAPre-anesthesia Checklist: Patient identified, Emergency Drugs available, Suction available, Patient being monitored and Timeout performed Patient Re-evaluated:Patient Re-evaluated prior to induction Oxygen Delivery Method: Nasal cannula Placement Confirmation: positive ETCO2 Dental Injury: Teeth and Oropharynx as per pre-operative assessment

## 2023-04-13 NOTE — Op Note (Signed)
[facility] Patient Name: Christian Carrillo Procedure Date: 04/13/2023 8:57 AM MRN: [id number] Date of Birth: 15-Jun-1951 Attending MD: Katrinka Blazing , , [id number] CSN: [id number] Age: 72 Admit Type: Outpatient Procedure:                Upper GI endoscopy Indications:              Dysphagia, history of jackhammer esophagus Providers:                Katrinka Blazing, Sheran Fava, Kristine L.                            Jessee Avers, Technician Referring MD:              Medicines:                Monitored Anesthesia Care Complications:            No immediate complications. Estimated Blood Loss:     Estimated blood loss: none. Procedure:                Pre-Anesthesia Assessment:                           - Prior to the procedure, a History and Physical                            was performed, and patient medications, allergies                            and sensitivities were reviewed. The patient's                            tolerance of previous anesthesia was reviewed.                           - The risks and benefits of the procedure and the                            sedation options and risks were discussed with the                            patient. All questions were answered and informed                            consent was obtained.                           - ASA Grade Assessment: II - A patient with mild                            systemic disease.                           After obtaining informed consent, the endoscope was                            passed under direct vision. Throughout the  procedure, the patient's blood pressure, pulse, and                            oxygen saturations were monitored continuously. The                            [id number]-[id number] ([id number]) scope was introduced through the                            mouth, and advanced to the second part of duodenum.                            The upper GI endoscopy was  accomplished without                            difficulty. The patient tolerated the procedure                            well. Scope In: 9:12:56 AM Scope Out: 9:18:28 AM Total Procedure Duration: 0 hours 5 minutes 32 seconds  Findings:      No endoscopic abnormality was evident in the esophagus to explain the       patient's complaint of dysphagia. It was decided, however, to proceed       with dilation of the entire esophagus. A guidewire was placed and the       scope was withdrawn. Dilation was performed with a Savary dilator with       mild resistance at 20 mm. The dilation site was examined following       endoscope reinsertion and showed mild mucosal disruption.      The stomach was normal.      The examined duodenum was normal. Impression:               - No endoscopic esophageal abnormality to explain                            patient's dysphagia. Esophagus dilated. Dilated.                           - Normal stomach.                           - Normal examined duodenum.                           - No specimens collected. Moderate Sedation:      Per Anesthesia Care Recommendation:           - Discharge patient to home (ambulatory).                           - Resume previous diet.                           - Continue present medications. Procedure Code(s):        --- Professional ---  16109, Esophagogastroduodenoscopy, flexible,                            transoral; with insertion of guide wire followed by                            passage of dilator(s) through esophagus over guide                            wire Diagnosis Code(s):        --- Professional ---                           R13.10, Dysphagia, unspecified CPT copyright 2022 American Medical Association. All rights reserved. The codes documented in this report are preliminary and upon coder review may  be revised to meet current compliance requirements. Katrinka Blazing, MD Katrinka Blazing,  04/13/2023 9:24:10 AM This report has been signed electronically. Number of Addenda: 0

## 2023-04-16 ENCOUNTER — Encounter (INDEPENDENT_AMBULATORY_CARE_PROVIDER_SITE_OTHER): Payer: Self-pay | Admitting: *Deleted

## 2023-04-16 LAB — SURGICAL PATHOLOGY

## 2023-04-18 ENCOUNTER — Encounter (HOSPITAL_COMMUNITY): Payer: Self-pay | Admitting: Gastroenterology

## 2023-07-16 ENCOUNTER — Encounter (INDEPENDENT_AMBULATORY_CARE_PROVIDER_SITE_OTHER): Payer: Self-pay | Admitting: Gastroenterology

## 2023-07-16 ENCOUNTER — Ambulatory Visit (INDEPENDENT_AMBULATORY_CARE_PROVIDER_SITE_OTHER): Payer: Medicare Other | Admitting: Gastroenterology

## 2023-07-16 VITALS — BP 110/63 | HR 51 | Temp 97.1°F | Ht 73.0 in | Wt 210.8 lb

## 2023-07-16 DIAGNOSIS — K219 Gastro-esophageal reflux disease without esophagitis: Secondary | ICD-10-CM

## 2023-07-16 DIAGNOSIS — R197 Diarrhea, unspecified: Secondary | ICD-10-CM

## 2023-07-16 DIAGNOSIS — K224 Dyskinesia of esophagus: Secondary | ICD-10-CM

## 2023-07-16 MED ORDER — DILTIAZEM HCL 60 MG PO TABS: ORAL_TABLET | ORAL | 3 refills | Status: DC

## 2023-07-16 MED ORDER — VOQUEZNA 10 MG PO TABS: 10.0000 mg | ORAL_TABLET | Freq: Every day | ORAL | 3 refills | Status: DC

## 2023-07-16 NOTE — Patient Instructions (Signed)
We will provide voquezna samples 20mg  daily x56 days and I will send a prescription for 10mg  daily to start thereafter, you can stop the nexium when you start this  I have sent a refill of your diltiazem for jackhammer esophagus I am also providing a handout for benefiber as discussed, this may help to bulk up your stool some, you can start with 1T once daily with a meal and gradually increase to up to three times per day if tolerating well, make sure water intake is good when taking fiber  Follow up 4 months  It was a pleasure to see you today. I want to create trusting relationships with patients and provide genuine, compassionate, and quality care. I truly value your feedback! please be on the lookout for a survey regarding your visit with me today. I appreciate your input about our visit and your time in completing this!    Anadia Helmes L. Jeanmarie Hubert, MSN, APRN, AGNP-C Adult-Gerontology Nurse Practitioner [facility] at [facility]

## 2023-07-16 NOTE — Progress Notes (Addendum)
Referring Provider: Kirstie Peri, MD Primary Care Physician:  Kirstie Peri, MD Primary GI Physician:   Chief Complaint  Patient presents with   Follow-up    Patient here today for a follow up on Diarrhea. Per Patient diarrhea is doing better, but he is still having issues with have to have a bm after every meal. He also says she is having issues with gerd, he is currently on esomeprazole 40 mg bid.   HPI:   Christian Carrillo is a 72 y.o. male with past medical history of COPD, depression, GERD, hypertension and jackhammer esophagus   Patient presenting today for follow up of diarrhea, GERD, jackhammer esophagus.   Last seen may 2024, at that time some improvement in diarrhea, having 1 watery BM per day. Heartburn some better on voquezna, having issues 1-2x/week. Taking voquezna daily. Taking diltiazem for jackhammer esophagus, 60mg  BID.   Recommended to finish course of voquezna, start nexium 40mg  BID once finished (insurance issue, needed to fail 3 PPIs to get voquezna covered), continue imodium PRN, diltiazem 60mg  TID, celiac and alpha gal.     Fecal fat and pancreatic elastase negative, TSH slightly elevated at 5.72, celiac and alpha gal negative.   Present: He states he is still having some diarrhea, stools are still quite soft or loose. He has fecal urgency. He notes sometimes stooling more after he eats, but having only 1 stool per day. He has some abdominal pain in lower abdomen, unsure of precipitating factors, feels that urinating sometimes alleviates his pain, sometimes having a BM sometimes makes pain better. No rectal bleeding or melena.  Weight has been stable since March, he has gained a few pounds since May. Not taking any imodium.   He has some occasional nausea in the mornings. Feels that acid reflux is better on nexium 40mg  BID. He felt that symptoms were better on Voquezna. He notes that he has a lot of acid regurgitation, occurring almost daily, feeling that he has a lot  of phlegm in his throat upon waking, has to cough sometimes to help with this. Occasionally takes pepto bismol if reflux is bothering him at night.   He states he ran out of his diltiazem for jack hammer esophagus about 1 week ago, he feels that he probably needs to be back on it.    Last Colonoscopy:- may 2024: Three 1 to 6 mm polyps in the ascending colon,                            removed with a cold snare. Resected and retrieved.                           - Diverticulosis in the sigmoid colon and in the                            descending colon. Biopsied-normal                            - The distal rectum and anal verge are normal on                            retroflexion view. (3 TAs)   Last Endoscopy:- May 2024: No endoscopic esophageal abnormality to explain  patient's dysphagia. Esophagus dilated. Dilated.                           - Normal stomach.                           - Normal examined duodenum.                           - No specimens collected.  Recommendations:  Repeat TCS 5 tyear   Past Medical History:  Diagnosis Date   Anxiety    Arthritis    Chronic back pain    COPD (chronic obstructive pulmonary disease) (HCC)    Depression    GERD (gastroesophageal reflux disease)    Hepatitis    Hypertension    Localized edema    Other emphysema (HCC)    Shortness of breath dyspnea     Past Surgical History:  Procedure Laterality Date   BACK SURGERY     BIOPSY  02/18/2020   Procedure: BIOPSY;  Surgeon: Malissa Hippo, MD;  [facility];  Service: Endoscopy;;  gastric   BIOPSY  04/13/2023   Procedure: BIOPSY;  Surgeon: Dolores Frame, MD;  [facility];  Service: Gastroenterology;;   cataract surgery     x 1 eye   COLONOSCOPY WITH PROPOFOL N/A 12/07/2021   Procedure: COLONOSCOPY WITH PROPOFOL;  Surgeon: Dolores Frame, MD;  [facility];  Service: Gastroenterology;  Laterality:  N/A;  9:45   COLONOSCOPY WITH PROPOFOL N/A 04/13/2023   Procedure: COLONOSCOPY WITH PROPOFOL;  Surgeon: Dolores Frame, MD;  [facility];  Service: Gastroenterology;  Laterality: N/A;  9:45am;ASA 2   ESOPHAGEAL DILATION N/A 02/18/2020   Procedure: ESOPHAGEAL DILATION;  Surgeon: Malissa Hippo, MD;  [facility];  Service: Endoscopy;  Laterality: N/A;   ESOPHAGOGASTRODUODENOSCOPY N/A 05/03/2016   Procedure: ESOPHAGOGASTRODUODENOSCOPY (EGD);  Surgeon: Malissa Hippo, MD;  [facility];  Service: Endoscopy;  Laterality: N/A;  1:00 - moved to 6/7 @ 7:30 - Ann notified pt   ESOPHAGOGASTRODUODENOSCOPY N/A 02/18/2020   Procedure: ESOPHAGOGASTRODUODENOSCOPY (EGD);  Surgeon: Malissa Hippo, MD;  [facility];  Service: Endoscopy;  Laterality: N/A;  240   ESOPHAGOGASTRODUODENOSCOPY (EGD) WITH PROPOFOL N/A 04/13/2023   Procedure: ESOPHAGOGASTRODUODENOSCOPY (EGD) WITH PROPOFOL;  Surgeon: Dolores Frame, MD;  [facility];  Service: Gastroenterology;  Laterality: N/A;   POLYPECTOMY  12/07/2021   Procedure: POLYPECTOMY;  Surgeon: Dolores Frame, MD;  [facility];  Service: Gastroenterology;;   POLYPECTOMY  04/13/2023   Procedure: POLYPECTOMY;  Surgeon: Marguerita Merles, Reuel Boom, MD;  [facility];  Service: Gastroenterology;;   rt femur fx     traction   rt shoulder surgery pinning.       Current Outpatient Medications  Medication Sig Dispense Refill   acetaminophen (TYLENOL) 500 MG tablet Take 1,000 mg by mouth every 6 (six) hours as needed for mild pain or moderate pain.     alfuzosin (UROXATRAL) 10 MG 24 hr tablet Take 10 mg by mouth daily.     amitriptyline (ELAVIL) 25 MG tablet Take 1 tablet by mouth at bedtime.     cloNIDine (CATAPRES) 0.1 MG tablet Take 0.1 mg by mouth 2 (two) times daily.     esomeprazole (NEXIUM) 40 MG capsule Take 1  capsule (40 mg total) by mouth 2 (two) times daily  before a meal. 180 capsule 1   FLUoxetine (PROZAC) 20 MG capsule Take 20 mg by mouth daily.   0   losartan (COZAAR) 100 MG tablet TAKE 1 TABLET(100 MG) BY MOUTH DAILY (Patient taking differently: Take 50 mg by mouth daily.) 90 tablet 3   rosuvastatin (CRESTOR) 5 MG tablet Take 5 mg by mouth daily.     tamsulosin (FLOMAX) 0.4 MG CAPS capsule Take 0.4 mg by mouth at bedtime.     tiZANidine (ZANAFLEX) 4 MG tablet Take 4 mg by mouth at bedtime.     diltiazem (CARDIZEM) 60 MG tablet TAKE 1 TABLET(60 MG) BY MOUTH EVERY 8 HOURS AS NEEDED FOR CHEST DISCOMFORT (Patient not taking: Reported on 07/16/2023) 90 tablet 3   Vonoprazan Fumarate (VOQUEZNA) 20 MG TABS Take 20 mg by mouth at bedtime. (Patient not taking: Reported on 07/16/2023)     No current facility-administered medications for this visit.    Allergies as of 07/16/2023 - Review Complete 07/16/2023  Allergen Reaction Noted   Codeine  04/11/2016    Family History  Problem Relation Age of Onset   Obesity Mother    Hypothyroidism Mother    Diabetes Mellitus II Mother    Cancer Father    Heart attack Brother    Diabetes Mellitus II Brother     Social History   Socioeconomic History   Marital status: Married    Spouse name: Not on file   Number of children: Not on file   Years of education: Not on file   Highest education level: Not on file  Occupational History   Not on file  Tobacco Use   Smoking status: Every Day    Current packs/day: 0.00    Average packs/day: 1 pack/day for 30.0 years (30.0 ttl pk-yrs)    Types: Cigarettes    Start date: 03/06/1986    Last attempt to quit: 03/06/2016    Years since quitting: 7.3   Smokeless tobacco: Never  Vaping Use   Vaping status: Never Used  Substance and Sexual Activity   Alcohol use: No    Alcohol/week: 0.0 standard drinks of alcohol   Drug use: No   Sexual activity: Not on file  Other Topics Concern   Not on file  Social History Narrative   Not on file   Social Determinants  of Health   Financial Resource Strain: Low Risk  (02/20/2023)   Received from [facility], [facility]   Overall Financial Resource Strain (CARDIA)    Difficulty of Paying Living Expenses: Not hard at all  Food Insecurity: No Food Insecurity (02/20/2023)   Received from [facility], [facility]   Hunger Vital Sign    Worried About Running Out of Food in the Last Year: Never true    Ran Out of Food in the Last Year: Never true  Transportation Needs: No Transportation Needs (02/20/2023)   Received from [facility], [facility]   [facility] - Transportation    Lack of Transportation (Medical): No    Lack of Transportation (Non-Medical): No  Physical Activity: Not on file  Stress: Not on file  Social Connections: Not on file    Review of systems General: negative for malaise, night sweats, fever, chills, weight loss Neck: Negative for lumps, goiter, pain and significant neck swelling Resp: Negative for cough, wheezing, dyspnea at rest CV: Negative for chest pain, leg swelling, palpitations, orthopnea GI:  denies melena, hematochezia, nausea, vomiting, constipation, dysphagia, odyonophagia, early satiety or unintentional weight loss. +diarrhea +gerd MSK: Negative for joint pain or swelling, back pain, and muscle pain. Derm: Negative for itching or rash Psych: Denies depression, anxiety, memory loss, confusion. No homicidal or suicidal ideation.  Heme: Negative for prolonged bleeding, bruising easily, and swollen nodes. Endocrine: Negative for cold or heat intolerance, polyuria, polydipsia and goiter. Neuro: negative for tremor, gait imbalance, syncope and seizures. The remainder of the review of systems is noncontributory.  Physical Exam: BP 110/63 (BP Location: Left Arm, Patient Position: Sitting, Cuff Size: Normal)   Pulse (!) 51   Temp (!) 97.1 F (36.2 C) (Temporal)   Ht 6\' 1"  (1.854 m)   Wt 210 lb 12.8 oz (95.6 kg)   BMI 27.81 kg/m  General:   Alert and  oriented. No distress noted. Pleasant and cooperative.  Head:  Normocephalic and atraumatic. Eyes:  Conjuctiva clear without scleral icterus. Mouth:  Oral mucosa pink and moist. Good dentition. No lesions. Heart: Normal rate and rhythm, s1 and s2 heart sounds present.  Lungs: Clear lung sounds in all lobes. Respirations equal and unlabored. Abdomen:  +BS, soft, non-tender and non-distended. No rebound or guarding. No HSM or masses noted. Derm: No palmar erythema or jaundice Msk:  Symmetrical without gross deformities. Normal posture. Extremities:  Without edema. Neurologic:  Alert and  oriented x4 Psych:  Alert and cooperative. Normal mood and affect.  Invalid input(s): "6 MONTHS"   ASSESSMENT: LEONID BAISE is a 72 y.o. male presenting today for follow up of GERD, diarrhea and jackhammer esophagus   Diarrhea: continues to have looser stools, usually after a meal, at baseline about 1 BM per day but with some urgency. Fecal fat, pancreatic elastase, celiac panel, alpha gal and random colonic biopsies all negative. TSH slightly elevated at 5.72 earlier this year. Reassuringly he has no rectal bleeding, melena or abdominal pain. He has gained a few pounds since earlier this year as well. Not currently taking anything for his diarrhea. Recommend starting Benefiber 1T daily to help bulk up his stool, can increase to up to TID if tolerating.  Jackhammer esophagus: previously doing well on diltiazem 60mg  up to TID PRN, though notes he ran out about 1 week ago and feels he needs to be back on it, wills end refill for this today.   GERD: previously on Voquezna and had improvement in GERD symptoms on this, however upon attempting to refill the question that his insurance denied this as he had not filled 3 other PPIs.  He is currently on Nexium and having frequent breakthrough symptoms.  At present he has been on pantoprazole, omeprazole and Nexium without improvement of his symptoms.  Will restart  Voquezna at 20 mg daily x 8 weeks and drop to 10 mg daily thereafter.  Samples of 20 mg dosing were provided to the patient.   PLAN:  Restart voquezna 20mg  daily x8 weeks (samples provided) Voquezna 10mg  daily thereafter 3. Refill of diltiazem 60mg  TID PRN  4. Start benefiber 1T BID-TID   All questions were answered, patient verbalized understanding and is in agreement with plan as outlined above.   Follow Up: 3 months   Amante Fomby L. Jeanmarie Hubert, MSN, APRN, AGNP-C Adult-Gerontology Nurse Practitioner [facility]  I have reviewed the note and agree with the APP's assessment as described in this progress note  Katrinka Blazing, MD Gastroenterology and Hepatology [facility]

## 2023-08-31 ENCOUNTER — Telehealth (INDEPENDENT_AMBULATORY_CARE_PROVIDER_SITE_OTHER): Payer: Self-pay | Admitting: *Deleted

## 2023-08-31 NOTE — Telephone Encounter (Signed)
Pt has 10 days left of voquezna 20mg  samples and suppose to drop to 10mg . Pa submitted for med through cover my meds. Await response.

## 2023-09-03 NOTE — Telephone Encounter (Signed)
Per [employer] Voquenza 10 mg approved through 08/30/2024, patient wife aware patient can pick up from the [facility] pharmacy.

## 2023-10-01 ENCOUNTER — Telehealth (INDEPENDENT_AMBULATORY_CARE_PROVIDER_SITE_OTHER): Payer: Self-pay | Admitting: *Deleted

## 2023-10-01 NOTE — Telephone Encounter (Signed)
Patient called wanting samples of voquezna 10mg . We do not have any. I called patient to see if he was compelely out of med and he said he was and went back on nexium. He said copay for 30 day supply of med is $99. Will call [facility] to see if he can get cheaper than local pharmacy he is using.

## 2023-10-04 NOTE — Telephone Encounter (Signed)
Discussed with patient and he does want to try zegerid 40mg  daily. [facility]. Do you want me to send in?

## 2023-10-05 ENCOUNTER — Other Ambulatory Visit (INDEPENDENT_AMBULATORY_CARE_PROVIDER_SITE_OTHER): Payer: Self-pay | Admitting: *Deleted

## 2023-10-05 MED ORDER — OMEPRAZOLE-SODIUM BICARBONATE 40-1100 MG PO CAPS: 1.0000 | ORAL_CAPSULE | Freq: Every day | ORAL | 1 refills | Status: DC

## 2023-10-08 ENCOUNTER — Other Ambulatory Visit (INDEPENDENT_AMBULATORY_CARE_PROVIDER_SITE_OTHER): Payer: Self-pay | Admitting: *Deleted

## 2023-10-08 NOTE — Telephone Encounter (Signed)
Med approved through [employer] [place] from 10/08/23 -10/07/24.  Approval letter faxed to [facility] and pt was notified.

## 2023-10-08 NOTE — Telephone Encounter (Signed)
Pa submitted through cover my meds. Await response.

## 2023-10-16 NOTE — Telephone Encounter (Signed)
error 

## 2023-10-24 ENCOUNTER — Other Ambulatory Visit (INDEPENDENT_AMBULATORY_CARE_PROVIDER_SITE_OTHER): Payer: Self-pay | Admitting: Gastroenterology

## 2023-10-24 DIAGNOSIS — K224 Dyskinesia of esophagus: Secondary | ICD-10-CM

## 2023-11-15 ENCOUNTER — Ambulatory Visit (INDEPENDENT_AMBULATORY_CARE_PROVIDER_SITE_OTHER): Payer: Medicare Other | Admitting: Gastroenterology

## 2024-01-07 ENCOUNTER — Other Ambulatory Visit (INDEPENDENT_AMBULATORY_CARE_PROVIDER_SITE_OTHER): Payer: Self-pay | Admitting: Gastroenterology

## 2024-01-07 DIAGNOSIS — K224 Dyskinesia of esophagus: Secondary | ICD-10-CM

## 2024-02-25 ENCOUNTER — Ambulatory Visit (INDEPENDENT_AMBULATORY_CARE_PROVIDER_SITE_OTHER): Admitting: Gastroenterology

## 2024-03-13 ENCOUNTER — Ambulatory Visit (INDEPENDENT_AMBULATORY_CARE_PROVIDER_SITE_OTHER): Admitting: Gastroenterology

## 2024-03-13 ENCOUNTER — Encounter (INDEPENDENT_AMBULATORY_CARE_PROVIDER_SITE_OTHER): Payer: Self-pay | Admitting: Gastroenterology

## 2024-03-13 VITALS — BP 125/65 | HR 55 | Temp 97.1°F | Ht 73.0 in | Wt 232.6 lb

## 2024-03-13 DIAGNOSIS — R197 Diarrhea, unspecified: Secondary | ICD-10-CM | POA: Diagnosis not present

## 2024-03-13 NOTE — Patient Instructions (Signed)
 We will refer you for SIBO testing to rule this out as a cause of your diarrhea, someone from [facility] will reach out to schedule this For now you can take imodium as needed for diarrhea, do not exceed 4 tablets per 24 hours I am also providing the low FODMAP food guide as this can be helpful in determining triggers of diarrhea Make sure to stay well hydrated alternating water and low sugar electrolyte drinks  Follow up 3 months  It was a pleasure to see you today. I want to create trusting relationships with patients and provide genuine, compassionate, and quality care. I truly value your feedback! please be on the lookout for a survey regarding your visit with me today. I appreciate your input about our visit and your time in completing this!    Kennesha Brewbaker L. Joash Tony, MSN, APRN, AGNP-C Adult-Gerontology Nurse Practitioner [facility] at [facility]

## 2024-03-13 NOTE — Progress Notes (Addendum)
 Referring Provider: Kirstie Peri, MD Primary Care Physician:  Kirstie Peri, MD Primary GI Physician: Dr. Levon Hedger   Chief Complaint  Patient presents with   Diarrhea    Patient here today due to having issues with after eating he has explosive diarrhea. This has started since starting a new gout medication. He says he has since stopped this medication and the diarrhea never went away. He has gotten so dehydrated from this and ended up in a recent hospitalization with low electrolytes.   HPI:   Christian Carrillo is a 73 y.o. male with past medical history of COPD, depression, GERD, hypertension and jackhammer esophagus   Patient presenting today for:  Diarrhea  Last seen August 2024, at that time having looser to soft stools with some urgency, but 1 stool per day. Some lower abdominal discomfort. Occasional nausea in the am. Reflux flaring up on nexium. 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   Recommended to restart voquezna 20mg  x8 weeks, then 10mg  daily therafter, refill of diltI TID PRN for [facility] esophagus, start benefiber BID-TID.  Present:  Patient had recent admission at the end of march at [facility] for electrolyte abnormality, having diarrhea at that time, gi pathogen panel ordered but not completed. TSH on earlier this month was 2.865, potassium was 5.2 on 4/14 (with supplementation) sodium 141, hgb 10.5  Patient arrives with his wife who helps provide history. Diarrhea off and on since atleast 2024 but worse over the last 6 months with urgency especially after eating. He had a recent hospitalization with hypokalemia and was sent home with magnesium and potassium supplementation. States he was given dicyclomine for diarrhea when he left the hospital but this has not helped. His PCP also gave him something for diarrhea but they have not gotten this filled yet since he was coming here. He has no abdominal pain. Having 3-4 BMs per day. He is not  losing weight, wife states legs have been swollen as well, she also notes that his stomach appears to be swollen at times. He is currently on mag oxide but notes no difference in diarrhea since starting this. He did take a new gout  medication about 1 year ago and noted worsening of diarrhea with this but nothing changed after he stopped it. Does not seem to matter what he eats in regards to his diarrhea. Denies any rectal bleeding or melena. States they were told by the hospital that they could not do any stool testing as he had only one solid stool while there. He endorses feeling more bloated and having more gas, wife states he often wears overalls so as not to squeeze his abdomen.     Last Colonoscopy:- may 2024: Three 1 to 6 mm polyps in the ascending colon,                            removed with a cold snare. Resected and retrieved.                           - Diverticulosis in the sigmoid colon and in the                            descending colon. Biopsied-normal                            -  The distal rectum and anal verge are normal on                            retroflexion view. (3 TAs)    Last Endoscopy:- May 2024: No endoscopic esophageal abnormality to explain                            patient's dysphagia. Esophagus dilated. Dilated.                           - Normal stomach.                           - Normal examined duodenum.                           - No specimens collected.   Recommendations:  Repeat TCS 5 year   Past Medical History:  Diagnosis Date   Anxiety    Arthritis    Chronic back pain    COPD (chronic obstructive pulmonary disease) (HCC)    Depression    GERD (gastroesophageal reflux disease)    Hepatitis    Hypertension    Localized edema    Other emphysema (HCC)    Shortness of breath dyspnea     Past Surgical History:  Procedure Laterality Date   BACK SURGERY     BIOPSY  02/18/2020   Procedure: BIOPSY;  Surgeon: Ruby Corporal, MD;   [facility];  Service: Endoscopy;;  gastric   BIOPSY  04/13/2023   Procedure: BIOPSY;  Surgeon: Urban Garden, MD;  [facility];  Service: Gastroenterology;;   cataract surgery     x 1 eye   COLONOSCOPY WITH PROPOFOL N/A 12/07/2021   Procedure: COLONOSCOPY WITH PROPOFOL;  Surgeon: Urban Garden, MD;  [facility];  Service: Gastroenterology;  Laterality: N/A;  9:45   COLONOSCOPY WITH PROPOFOL N/A 04/13/2023   Procedure: COLONOSCOPY WITH PROPOFOL;  Surgeon: Urban Garden, MD;  [facility];  Service: Gastroenterology;  Laterality: N/A;  9:45am;ASA 2   ESOPHAGEAL DILATION N/A 02/18/2020   Procedure: ESOPHAGEAL DILATION;  Surgeon: Ruby Corporal, MD;  [facility];  Service: Endoscopy;  Laterality: N/A;   ESOPHAGOGASTRODUODENOSCOPY N/A 05/03/2016   Procedure: ESOPHAGOGASTRODUODENOSCOPY (EGD);  Surgeon: Ruby Corporal, MD;  [facility];  Service: Endoscopy;  Laterality: N/A;  1:00 - moved to 6/7 @ 7:30 - Ann notified pt   ESOPHAGOGASTRODUODENOSCOPY N/A 02/18/2020   Procedure: ESOPHAGOGASTRODUODENOSCOPY (EGD);  Surgeon: Ruby Corporal, MD;  [facility];  Service: Endoscopy;  Laterality: N/A;  240   ESOPHAGOGASTRODUODENOSCOPY (EGD) WITH PROPOFOL N/A 04/13/2023   Procedure: ESOPHAGOGASTRODUODENOSCOPY (EGD) WITH PROPOFOL;  Surgeon: Urban Garden, MD;  [facility];  Service: Gastroenterology;  Laterality: N/A;   POLYPECTOMY  12/07/2021   Procedure: POLYPECTOMY;  Surgeon: Urban Garden, MD;  [facility];  Service: Gastroenterology;;   POLYPECTOMY  04/13/2023   Procedure: POLYPECTOMY;  Surgeon: Umberto Ganong, Bearl Limes, MD;  [facility];  Service: Gastroenterology;;   rt femur fx     traction   rt shoulder surgery pinning.       Current Outpatient Medications  Medication Sig Dispense Refill   acetaminophen (TYLENOL) 500  MG tablet  Take 1,000 mg by mouth every 6 (six) hours as needed for mild pain or moderate pain.     amitriptyline (ELAVIL) 10 MG tablet Take 1 tablet by mouth at bedtime.     chlorthalidone (HYGROTON) 25 MG tablet Take 25 mg by mouth daily.     cloNIDine (CATAPRES) 0.2 MG tablet Take 0.2 mg by mouth 2 (two) times daily.     diclofenac (VOLTAREN) 75 MG EC tablet Take 75 mg by mouth 2 (two) times daily.     dicyclomine (BENTYL) 10 MG capsule Take 10 mg by mouth in the morning, at noon, in the evening, and at bedtime.     diltiazem (CARDIZEM) 60 MG tablet TAKE 1 TABLET BY MOUTH EVERY 8 HOURS AS NEEDED FOR CHEST discomfort 30 tablet 0   FLUoxetine (PROZAC) 20 MG capsule Take 20 mg by mouth daily.   0   magnesium oxide (MAG-OX) 400 (240 Mg) MG tablet Take 400 mg by mouth 2 (two) times daily.     metoprolol tartrate (LOPRESSOR) 50 MG tablet Take 50 mg by mouth daily.     olmesartan (BENICAR) 40 MG tablet Take 40 mg by mouth daily.     omeprazole (PRILOSEC) 40 MG capsule Take 40 mg by mouth daily.     rosuvastatin (CRESTOR) 5 MG tablet Take 5 mg by mouth daily.     tamsulosin (FLOMAX) 0.4 MG CAPS capsule Take 0.4 mg by mouth at bedtime.     traZODone (DESYREL) 100 MG tablet Take 100 mg by mouth at bedtime.     No current facility-administered medications for this visit.    Allergies as of 03/13/2024 - Review Complete 03/13/2024  Allergen Reaction Noted   Codeine  04/11/2016    Social History   Socioeconomic History   Marital status: Married    Spouse name: Not on file   Number of children: Not on file   Years of education: Not on file   Highest education level: Not on file  Occupational History   Not on file  Tobacco Use   Smoking status: Every Day    Current packs/day: 0.00    Average packs/day: 1 pack/day for 30.0 years (30.0 ttl pk-yrs)    Types: Cigarettes    Start date: 03/06/1986    Last attempt to quit: 03/06/2016    Years since quitting: 8.0   Smokeless tobacco: Never  Vaping Use    Vaping status: Never Used  Substance and Sexual Activity   Alcohol use: No    Alcohol/week: 0.0 standard drinks of alcohol   Drug use: No   Sexual activity: Not on file  Other Topics Concern   Not on file  Social History Narrative   Not on file   Social Drivers of Health   Financial Resource Strain: Low Risk  (02/20/2023)   Received from [facility], [facility]   Overall Financial Resource Strain (CARDIA)    Difficulty of Paying Living Expenses: Not hard at all  Food Insecurity: No Food Insecurity (02/20/2023)   Received from [facility], [facility]   Hunger Vital Sign    Worried About Running Out of Food in the Last Year: Never true    Ran Out of Food in the Last Year: Never true  Transportation Needs: No Transportation Needs (02/20/2023)   Received from [facility], [facility]   [facility] - Transportation    Lack of Transportation (Medical): No    Lack of Transportation (  Non-Medical): No  Physical Activity: Not on file  Stress: Not on file  Social Connections: Not on file    Review of systems General: negative for malaise, night sweats, fever, chills, weight loss Neck: Negative for lumps, goiter, pain and significant neck swelling Resp: Negative for cough, wheezing, dyspnea at rest CV: Negative for chest pain, leg swelling, palpitations, orthopnea GI: denies melena, hematochezia, nausea, vomiting, constipation, dysphagia, odyonophagia, early satiety or unintentional weight loss. +diarrhea The remainder of the review of systems is noncontributory.  Physical Exam: BP 125/65 (BP Location: Left Arm, Patient Position: Sitting, Cuff Size: Large)   Pulse (!) 55   Temp (!) 97.1 F (36.2 C) (Temporal)   Ht 6\' 1"  (1.854 m)   Wt 232 lb 9.6 oz (105.5 kg)   BMI 30.69 kg/m  General:   Alert and oriented. No distress noted. Pleasant and cooperative.  Head:  Normocephalic and atraumatic. Eyes:  Conjuctiva clear without scleral icterus. Mouth:  Oral mucosa  pink and moist. Good dentition. No lesions. Heart: Normal rate and rhythm, s1 and s2 heart sounds present.  Lungs: Clear lung sounds in all lobes. Respirations equal and unlabored. Abdomen:  +BS, soft, non-tender and non-distended. No rebound or guarding. No HSM or masses noted. Extremities:  Without edema. Neurologic:  Alert and  oriented x4 Psych:  Alert and cooperative. Normal mood and affect.  Invalid input(s): "6 MONTHS"   ASSESSMENT: Christian Carrillo is a 73 y.o. male presenting today for diarrhea   Diarrhea: history of the same, had fecal fat and pancreatic elastase, alpha gal, celiac panel back in 2024 that were all normal. Stool testing at that time was also negative. He underwent Colonoscopy with small bowel biopsies that were also normal. No recent antibiotics and given chronicity, low suspicion for infectious etiology. Diarrhea has been worse over the past 6 months since taking medication for gout. No weight loss, abdominal pain, blood in stools or melena. He endorses bloating and flatulence. Will refer for SIBO/SIMO testing to rule this out as a cause of his diarrhea. For now can take imodium PRN, and will provide low FODMAP diet to see if this provides any relief. He should let me know if imodium does not slow his diarrhea. Can stop dicyclomine as it has provided no relief.    PLAN:  -SIBO/SIMO testing -imodium PRN -low FODMAP diet -stay well hydrated with water/low sugar electrolyte drinks   All questions were answered, patient verbalized understanding and is in agreement with plan as outlined above.   Follow Up: 3 months   Akaya Proffit L. Adrien Alberta, MSN, APRN, AGNP-C Adult-Gerontology Nurse Practitioner [facility]  I have reviewed the note and agree with the APP's assessment as described in this progress note  Samantha Cress, MD Gastroenterology and Hepatology [facility]

## 2024-04-01 ENCOUNTER — Ambulatory Visit (INDEPENDENT_AMBULATORY_CARE_PROVIDER_SITE_OTHER): Admitting: Gastroenterology

## 2024-04-03 ENCOUNTER — Encounter: Payer: Self-pay | Admitting: Cardiology

## 2024-04-03 ENCOUNTER — Encounter: Payer: Self-pay | Admitting: *Deleted

## 2024-04-03 ENCOUNTER — Ambulatory Visit: Attending: Cardiology | Admitting: Cardiology

## 2024-04-03 VITALS — BP 158/64 | HR 56 | Ht 73.0 in | Wt 234.0 lb

## 2024-04-03 DIAGNOSIS — R001 Bradycardia, unspecified: Secondary | ICD-10-CM | POA: Diagnosis not present

## 2024-04-03 DIAGNOSIS — I10 Essential (primary) hypertension: Secondary | ICD-10-CM

## 2024-04-03 DIAGNOSIS — I509 Heart failure, unspecified: Secondary | ICD-10-CM | POA: Diagnosis not present

## 2024-04-03 MED ORDER — FUROSEMIDE 40 MG PO TABS: 40.0000 mg | ORAL_TABLET | Freq: Every day | ORAL | 6 refills | Status: DC

## 2024-04-03 NOTE — Patient Instructions (Addendum)
 Medication Instructions:   Stop Chlorthalidone  Begin Lasix  40mg  daily  Stop Toprol XL (Metoprolol Succ) Continue all other medications.     Labwork:  none  Testing/Procedures:  none  Follow-Up:  1 week   Any Other Special Instructions Will Be Listed Below (If Applicable).   If you need a refill on your cardiac medications before your next appointment, please call your pharmacy.

## 2024-04-03 NOTE — Progress Notes (Signed)
 Clinical Summary Christian Carrillo is a 73 y.o.male seen as a new patient today. Last seen in our office 05/2017    1. LE edema/Chronic diastolic HF - 02/15/17 echo at [facility]: LVEF >65%, grade I diastolic dysfunction, normal LA, normal RV, possible PFO - recent weight gain 40 lbs over 2 months per his report. Abdominal distension. LE edema - abdominal US  without ascites.      - recent leg swelling, abdominal swelling - recent SOB/DOE - some SOB with laying flat.  - had been on lasix , reports potassium got low.  02/2024 Mg 0.6 K 3.5. Had some diarrhea at the time - reports recent echo with pcp, we are calling to get report     2. HTN - compliant with meds - recent issues with high bp's.  - takes naproxen bid.  - home bp's 140/80s.    - bp log 02/2017 reasonable control  - home 210s/100s in the AM. In the afternoons 110s/60s - pcp had been adjusting meds - they report calibrated home bp cuffs with pcp  chronic fatigue, pehaps related to clonidine.     3. Low HRs - HRs to mid 50s - can have some lightheadedness/dizziness -   4. OSA  - he is on cpap  2. PFO - 02/15/17 echo at [facility]: LVEF >65%, grade I diastolic dysfunction, normal LA, normal RV, possible PFO   3. COPD - managed by pcp   4. History of hep C - followed by GI  Past Medical History:  Diagnosis Date   Anxiety    Arthritis    Chronic back pain    COPD (chronic obstructive pulmonary disease) (HCC)    Depression    GERD (gastroesophageal reflux disease)    Hepatitis    Hypertension    Localized edema    Other emphysema (HCC)    Shortness of breath dyspnea      Allergies  Allergen Reactions   Codeine     itching     Current Outpatient Medications  Medication Sig Dispense Refill   acetaminophen (TYLENOL) 500 MG tablet Take 1,000 mg by mouth every 6 (six) hours as needed for mild pain or moderate pain.     amitriptyline (ELAVIL) 10 MG tablet Take 1 tablet by mouth at bedtime.      chlorthalidone  (HYGROTON ) 25 MG tablet Take 25 mg by mouth daily.     cloNIDine (CATAPRES) 0.2 MG tablet Take 0.2 mg by mouth 2 (two) times daily.     diclofenac (VOLTAREN) 75 MG EC tablet Take 75 mg by mouth 2 (two) times daily.     dicyclomine (BENTYL) 10 MG capsule Take 10 mg by mouth in the morning, at noon, in the evening, and at bedtime.     diltiazem  (CARDIZEM ) 60 MG tablet TAKE 1 TABLET BY MOUTH EVERY 8 HOURS AS NEEDED FOR CHEST discomfort 30 tablet 0   FLUoxetine (PROZAC) 20 MG capsule Take 20 mg by mouth daily.   0   magnesium  oxide (MAG-OX) 400 (240 Mg) MG tablet Take 400 mg by mouth 2 (two) times daily.     metoprolol tartrate (LOPRESSOR) 50 MG tablet Take 50 mg by mouth daily.     olmesartan (BENICAR) 40 MG tablet Take 40 mg by mouth daily.     omeprazole  (PRILOSEC) 40 MG capsule Take 40 mg by mouth daily.     rosuvastatin (CRESTOR) 5 MG tablet Take 5 mg by mouth daily.     tamsulosin (FLOMAX) 0.4  MG CAPS capsule Take 0.4 mg by mouth at bedtime.     traZODone (DESYREL) 100 MG tablet Take 100 mg by mouth at bedtime.     No current facility-administered medications for this visit.     Past Surgical History:  Procedure Laterality Date   BACK SURGERY     BIOPSY  02/18/2020   Procedure: BIOPSY;  Surgeon: Ruby Corporal, MD;  [facility];  Service: Endoscopy;;  gastric   BIOPSY  04/13/2023   Procedure: BIOPSY;  Surgeon: Urban Garden, MD;  [facility];  Service: Gastroenterology;;   cataract surgery     x 1 eye   COLONOSCOPY WITH PROPOFOL  N/A 12/07/2021   Procedure: COLONOSCOPY WITH PROPOFOL ;  Surgeon: Urban Garden, MD;  [facility];  Service: Gastroenterology;  Laterality: N/A;  9:45   COLONOSCOPY WITH PROPOFOL  N/A 04/13/2023   Procedure: COLONOSCOPY WITH PROPOFOL ;  Surgeon: Urban Garden, MD;  [facility];  Service: Gastroenterology;  Laterality: N/A;  9:45am;ASA 2   ESOPHAGEAL DILATION N/A  02/18/2020   Procedure: ESOPHAGEAL DILATION;  Surgeon: Ruby Corporal, MD;  [facility];  Service: Endoscopy;  Laterality: N/A;   ESOPHAGOGASTRODUODENOSCOPY N/A 05/03/2016   Procedure: ESOPHAGOGASTRODUODENOSCOPY (EGD);  Surgeon: Ruby Corporal, MD;  [facility];  Service: Endoscopy;  Laterality: N/A;  1:00 - moved to 6/7 @ 7:30 - Ann notified pt   ESOPHAGOGASTRODUODENOSCOPY N/A 02/18/2020   Procedure: ESOPHAGOGASTRODUODENOSCOPY (EGD);  Surgeon: Ruby Corporal, MD;  [facility];  Service: Endoscopy;  Laterality: N/A;  240   ESOPHAGOGASTRODUODENOSCOPY (EGD) WITH PROPOFOL  N/A 04/13/2023   Procedure: ESOPHAGOGASTRODUODENOSCOPY (EGD) WITH PROPOFOL ;  Surgeon: Urban Garden, MD;  [facility];  Service: Gastroenterology;  Laterality: N/A;   POLYPECTOMY  12/07/2021   Procedure: POLYPECTOMY;  Surgeon: Urban Garden, MD;  [facility];  Service: Gastroenterology;;   POLYPECTOMY  04/13/2023   Procedure: POLYPECTOMY;  Surgeon: Umberto Ganong, Bearl Limes, MD;  [facility];  Service: Gastroenterology;;   rt femur fx     traction   rt shoulder surgery pinning.        Allergies  Allergen Reactions   Codeine     itching      Family History  Problem Relation Age of Onset   Obesity Mother    Hypothyroidism Mother    Diabetes Mellitus II Mother    Cancer Father    Heart attack Brother    Diabetes Mellitus II Brother      Social History Christian Carrillo reports that he has been smoking cigarettes. He started smoking about 38 years ago. He has a 30 pack-year smoking history. He has never used smokeless tobacco. Christian Carrillo reports no history of alcohol  use.    Physical Examination Today's Vitals   04/03/24 1518  BP: (!) 158/64  Pulse: (!) 56  SpO2: 95%  Weight: 234 lb (106.1 kg)  Height: 6\' 1"  (1.854 m)   Body mass index is 30.87 kg/m.  Gen: resting comfortably, no acute distress HEENT: no scleral  icterus, pupils equal round and reactive, no palptable cervical adenopathy,  CV: RRR, no mrg, no jvd Resp: Clear to auscultation bilaterally GI: abdomen is soft, non-tender, non-distended, normal bowel sounds, no hepatosplenomegaly MSK: extremities are warm, 1+ bilateral LE edema.  Skin: warm, no rash Neuro:  no focal deficits Psych: appropriate affect    Assessment and Plan   1. Chronic diasotlic heart failure -  recent increased LE edema - we will stop chlothalidone, start lasix  40mg  daily - request results from recent echo with pcp     2. HTN -labile bp at home per report - he will bring bp log to next visit  3. Bradycardia  - stopping toprol, monitor rates     Laurann Pollock, M.D.

## 2024-04-07 ENCOUNTER — Encounter: Payer: Self-pay | Admitting: Cardiology

## 2024-04-10 ENCOUNTER — Encounter: Payer: Self-pay | Admitting: Cardiology

## 2024-04-10 ENCOUNTER — Ambulatory Visit: Attending: Cardiology | Admitting: Cardiology

## 2024-04-10 VITALS — BP 140/75 | HR 64 | Ht 72.0 in | Wt 238.4 lb

## 2024-04-10 DIAGNOSIS — G4730 Sleep apnea, unspecified: Secondary | ICD-10-CM

## 2024-04-10 DIAGNOSIS — I5033 Acute on chronic diastolic (congestive) heart failure: Secondary | ICD-10-CM

## 2024-04-10 DIAGNOSIS — I10 Essential (primary) hypertension: Secondary | ICD-10-CM

## 2024-04-10 DIAGNOSIS — Z79899 Other long term (current) drug therapy: Secondary | ICD-10-CM

## 2024-04-10 MED ORDER — FUROSEMIDE 40 MG PO TABS: 60.0000 mg | ORAL_TABLET | Freq: Every day | ORAL | 3 refills | Status: DC

## 2024-04-10 MED ORDER — SPIRONOLACTONE 25 MG PO TABS
25.0000 mg | ORAL_TABLET | Freq: Every day | ORAL | 3 refills | Status: DC
Start: 2024-04-10 — End: 2024-06-10

## 2024-04-10 NOTE — Patient Instructions (Addendum)
 Medication Instructions:   Stop Taking Chlorthalidone   Increase Lasix  to 60 mg Daily  Start Spironolactone 25 mg Daily   Monitor blood pressure and weight for 1 week and report readings.   *If you need a refill on your cardiac medications before your next appointment, please call your pharmacy*  Lab Work: Your physician recommends that you return for lab work in: 2 Weeks at [facility].   If you have labs (blood work) drawn today and your tests are completely normal, you will receive your results only by: MyChart Message (if you have MyChart) OR A paper copy in the mail If you have any lab test that is abnormal or we need to change your treatment, we will call you to review the results.  Testing/Procedures: NONE   Follow-Up: At [facility], you and your health needs are our priority.  As part of our continuing mission to provide you with exceptional heart care, our providers are all part of one team.  This team includes your primary Cardiologist (physician) and Advanced Practice Providers or APPs (Physician Assistants and Nurse Practitioners) who all work together to provide you with the care you need, when you need it.  Your next appointment:   4 -6 week(s)  Provider:   You may see Armida Lander, MD or the following Advanced Practice Provider on your designated Care Team:   Lasalle Pointer, NP    We recommend signing up for the patient portal called "MyChart".  Sign up information is provided on this After Visit Summary.  MyChart is used to connect with patients for Virtual Visits (Telemedicine).  Patients are able to view lab/test results, encounter notes, upcoming appointments, etc.  Non-urgent messages can be sent to your provider as well.   To learn more about what you can do with MyChart, go to [email or web address].   Other Instructions Thank you for choosing [facility]!

## 2024-04-10 NOTE — Progress Notes (Signed)
 Clinical Summary Christian Carrillo is a 73 y.o.male seen today for follow up of the following medical problems.   1. LE edema/Chronic diastolic HF - 02/15/17 echo at [facility]: LVEF >65%, grade I diastolic dysfunction, normal LA, normal RV, possible PFO    - recent leg swelling, abdominal swelling, SOB/DOE - some SOB with laying flat.   12/2023 labs: Cr 1.1 K 4.4 Pro BNP 910 TSH 3 01/2024 echo with pcp: LVEF 54%, grade I dd, normal RV, no significant valve pathology.  - last visit we stopped chlorthalidone , started lasix  40mg  daily - no change in weights, no change LE edema. Some ongoing SOB.  - home weights 227- 231 and stable. No significant decline since starting lasix  40mg  daily.    2. HTN  - last visit chlorthalidone  was stopped and lasix  started for LE edema - toprol stopped due to low heart rates.  - home SBPs 190s primarily. We validated there home cuff with ours - of note  - olmesartan 40mg  in evenign. Clonidine 0.2mg  bid. On diltiazem  60mg  bid for history of severe esophageal spasm as opposed to a primary HTN cause.     3. Low HRs - reported home HRs to mid 50s last visit - can have some lightheadedness/dizziness - last visit we stopped his toprol - home HRs improves now 60s to 90s.      4. OSA  - he is on cpap, reports started cpap about 1 year ago - is not being followed by a sleep provider.    5. COPD - managed by pcp   6. History of hep C - followed by GI  7. Carotid stenosis - CTA 40-50% ICA stenosis    Past Medical History:  Diagnosis Date   Anxiety    Arthritis    Chronic back pain    COPD (chronic obstructive pulmonary disease) (HCC)    Depression    GERD (gastroesophageal reflux disease)    Hepatitis    Hypertension    Localized edema    Other emphysema (HCC)    Shortness of breath dyspnea      Allergies  Allergen Reactions   Codeine     itching     Current Outpatient Medications  Medication Sig Dispense Refill   acetaminophen  (TYLENOL) 500 MG tablet Take 1,000 mg by mouth every 6 (six) hours as needed for mild pain or moderate pain.     amitriptyline (ELAVIL) 10 MG tablet Take 1 tablet by mouth at bedtime.     cloNIDine (CATAPRES) 0.2 MG tablet Take 0.2 mg by mouth 2 (two) times daily.     diclofenac (VOLTAREN) 75 MG EC tablet Take 75 mg by mouth 2 (two) times daily.     dicyclomine (BENTYL) 10 MG capsule Take 10 mg by mouth in the morning, at noon, in the evening, and at bedtime.     diltiazem  (CARDIZEM ) 60 MG tablet TAKE 1 TABLET BY MOUTH EVERY 8 HOURS AS NEEDED FOR CHEST discomfort 30 tablet 0   FLUoxetine (PROZAC) 20 MG capsule Take 20 mg by mouth daily.   0   furosemide  (LASIX ) 40 MG tablet Take 1.5 tablets (60 mg total) by mouth daily. 135 tablet 3   magnesium  oxide (MAG-OX) 400 (240 Mg) MG tablet Take 400 mg by mouth 2 (two) times daily.     olmesartan (BENICAR) 40 MG tablet Take 40 mg by mouth daily.     omeprazole  (PRILOSEC) 40 MG capsule Take 40 mg by mouth daily.  rosuvastatin (CRESTOR) 5 MG tablet Take 5 mg by mouth daily.     spironolactone (ALDACTONE) 25 MG tablet Take 1 tablet (25 mg total) by mouth daily. 90 tablet 3   tamsulosin (FLOMAX) 0.4 MG CAPS capsule Take 0.4 mg by mouth at bedtime.     traZODone (DESYREL) 100 MG tablet Take 100 mg by mouth at bedtime.     No current facility-administered medications for this visit.     Past Surgical History:  Procedure Laterality Date   BACK SURGERY     BIOPSY  02/18/2020   Procedure: BIOPSY;  Surgeon: Ruby Corporal, MD;  [facility];  Service: Endoscopy;;  gastric   BIOPSY  04/13/2023   Procedure: BIOPSY;  Surgeon: Urban Garden, MD;  [facility];  Service: Gastroenterology;;   cataract surgery     x 1 eye   COLONOSCOPY WITH PROPOFOL  N/A 12/07/2021   Procedure: COLONOSCOPY WITH PROPOFOL ;  Surgeon: Urban Garden, MD;  [facility];  Service: Gastroenterology;  Laterality: N/A;  9:45    COLONOSCOPY WITH PROPOFOL  N/A 04/13/2023   Procedure: COLONOSCOPY WITH PROPOFOL ;  Surgeon: Urban Garden, MD;  [facility];  Service: Gastroenterology;  Laterality: N/A;  9:45am;ASA 2   ESOPHAGEAL DILATION N/A 02/18/2020   Procedure: ESOPHAGEAL DILATION;  Surgeon: Ruby Corporal, MD;  [facility];  Service: Endoscopy;  Laterality: N/A;   ESOPHAGOGASTRODUODENOSCOPY N/A 05/03/2016   Procedure: ESOPHAGOGASTRODUODENOSCOPY (EGD);  Surgeon: Ruby Corporal, MD;  [facility];  Service: Endoscopy;  Laterality: N/A;  1:00 - moved to 6/7 @ 7:30 - Ann notified pt   ESOPHAGOGASTRODUODENOSCOPY N/A 02/18/2020   Procedure: ESOPHAGOGASTRODUODENOSCOPY (EGD);  Surgeon: Ruby Corporal, MD;  [facility];  Service: Endoscopy;  Laterality: N/A;  240   ESOPHAGOGASTRODUODENOSCOPY (EGD) WITH PROPOFOL  N/A 04/13/2023   Procedure: ESOPHAGOGASTRODUODENOSCOPY (EGD) WITH PROPOFOL ;  Surgeon: Urban Garden, MD;  [facility];  Service: Gastroenterology;  Laterality: N/A;   POLYPECTOMY  12/07/2021   Procedure: POLYPECTOMY;  Surgeon: Urban Garden, MD;  [facility];  Service: Gastroenterology;;   POLYPECTOMY  04/13/2023   Procedure: POLYPECTOMY;  Surgeon: Umberto Ganong, Bearl Limes, MD;  [facility];  Service: Gastroenterology;;   rt femur fx     traction   rt shoulder surgery pinning.        Allergies  Allergen Reactions   Codeine     itching      Family History  Problem Relation Age of Onset   Obesity Mother    Hypothyroidism Mother    Diabetes Mellitus II Mother    Cancer Father    Heart attack Brother    Diabetes Mellitus II Brother      Social History Christian Carrillo reports that he has been smoking cigarettes. He started smoking about 38 years ago. He has a 30 pack-year smoking history. He has never used smokeless tobacco. Christian Carrillo reports no history of alcohol  use.    Physical  Examination Today's Vitals   04/10/24 1412 04/10/24 1523  BP: (!) 140/68 (!) 140/75  Pulse: 64   SpO2: 96%   Weight: 238 lb 6.4 oz (108.1 kg)   Height: 6' (1.829 m)   PainSc: 6    PainLoc: Generalized    Body mass index is 32.33 kg/m.  Gen: resting comfortably, no acute distress HEENT: no scleral icterus, pupils equal round and reactive, no palptable cervical adenopathy,  CV: RRR, no  m/rg, no jvd Resp: mild crackles bilateral bases GI: abdomen is soft, non-tender, non-distended, normal bowel sounds, no hepatosplenomegaly MSK: 1+ bilateral LE edema Skin: warm, no rash Neuro:  no focal deficits Psych: appropriate affect   Diagnostic Studies  02/2024 CTA [facility] . Intracranial circulation: Negative examination.  2. Extracranial circulation: Left carotid system: 40-50% left ICA origin stenosis.      Assessment and Plan   1. Acute on chronic diasotlic heart failure - signs of fluid overloaded, not improved on lasix  40mg  daily - increase lasix  to 60mg  daily, check bmet/mg in 2 weeks.  - he will update us  on home weights in 1 week. Have been 227-231 lbs - may consider SGLT2i in near future.      2. HTN - elevated in clinic, home numbers tend to run higher. Home cuff validated today as accurate - he is on olmesartan 40mg , clonidine 0.2mg  bid, diltiazem  60mg  bid(on for history of severe esophageal spasms) - we will add aldactone 25mg  daily. With some additional diuresis on higher lasix  dosing may also have some bp improvement.  - had been on toprol, we stopped due to low heart rates. Would be hesitant to try increasing his diltiazem  for this reason, but may consider trial of 90mg  bid pending bp trend. Other options would include hydralazine.   3. OSA - on cpap for 1 year, has not been followed by a sleep medicine provider - refer to [place] [facility]   F/u 4-6 weeks.   Laurann Pollock, M.D.

## 2024-04-16 ENCOUNTER — Encounter: Payer: Self-pay | Admitting: Cardiology

## 2024-04-16 ENCOUNTER — Telehealth: Payer: Self-pay | Admitting: Cardiology

## 2024-04-16 DIAGNOSIS — K224 Dyskinesia of esophagus: Secondary | ICD-10-CM

## 2024-04-16 MED ORDER — DILTIAZEM HCL 60 MG PO TABS: 90.0000 mg | ORAL_TABLET | Freq: Two times a day (BID) | ORAL | Status: DC

## 2024-04-16 MED ORDER — FUROSEMIDE 80 MG PO TABS
80.0000 mg | ORAL_TABLET | Freq: Every day | ORAL | Status: DC
Start: 2024-04-16 — End: 2024-07-15

## 2024-04-16 NOTE — Telephone Encounter (Signed)
 Inpatient would be one option. Would try increase diltiazem  to 90mg  bid and lasix  to 80mg  daily and updating us  again early next week. If severe SOB however could consider ER evaluation but if breathing is stable can continue working on home meds. If on lasix  80mg  over the weekend weight does not trend down increase to 80mg  in AM and 40mg  in PM. Update us  on everything Monday  Letta Raw MD

## 2024-04-16 NOTE — Telephone Encounter (Signed)
 Mr. Everitt wife Benn Brash) came in with Christian Carrillo BP and Weight log. She states that he is so swollen and having a hard time catching his breath . Her question  was would Dr. Amanda Jungling consider sending him to the hospital to get the fluid off of his body.

## 2024-04-16 NOTE — Telephone Encounter (Signed)
 Patients wife notified and verbalized understanding. Patients wife stated that they would increase medications per providers recommendations and update our office on Monday of weights/bp  patients wife had no further questions or concerns at this time.

## 2024-04-22 ENCOUNTER — Telehealth: Payer: Self-pay | Admitting: Cardiology

## 2024-04-22 DIAGNOSIS — K224 Dyskinesia of esophagus: Secondary | ICD-10-CM

## 2024-04-22 MED ORDER — HYDRALAZINE HCL 25 MG PO TABS
25.0000 mg | ORAL_TABLET | Freq: Two times a day (BID) | ORAL | 1 refills | Status: DC
Start: 2024-04-22 — End: 2024-06-10

## 2024-04-22 MED ORDER — DILTIAZEM HCL 60 MG PO TABS: 90.0000 mg | ORAL_TABLET | Freq: Two times a day (BID) | ORAL | 2 refills | Status: DC

## 2024-04-22 MED ORDER — FUROSEMIDE 80 MG PO TABS: 80.0000 mg | ORAL_TABLET | Freq: Every morning | ORAL | 1 refills | Status: DC

## 2024-04-22 NOTE — Telephone Encounter (Signed)
 Patient and wife informed and verbalized understanding of plan. [facility] contacted, spoke with Summer and advised of changes and request to remove chlorthalidone  and metoprolol from pill pack.

## 2024-04-22 NOTE — Telephone Encounter (Signed)
 Spoke with wife who states that the pt has not lost any weight, current weight is 239 lbs. Pt's BP is still elevated at 199/102 hr 79. Pt is current taking Lasix  80 mg and Spironolactone  50 mg daily. SOB has stayed the same but he is swallowing more. Wife feels that the increase in fluid is causing him to swallow more frequently. Wife states that coming off the Metoprolol has helped the dizziness. Is scheduled to have labs done on Thursday. Wife would like to know it they should be done sooner? Please advise.

## 2024-04-22 NOTE — Telephone Encounter (Signed)
 Pts wife requesting a c/b

## 2024-04-22 NOTE — Telephone Encounter (Signed)
 I don't see that spironolactone  was increased to 50mg , I believe should still be on 25mg  daily. Please increase lasix  to 80mg  in AM and 40mg  in PM. Start hydralazine 25mg  bid. Update us  on everything on Friday please  Letta Raw MD

## 2024-04-22 NOTE — Telephone Encounter (Signed)
 Pt c/o medication issue:  1. Name of Medication:   spironolactone  (ALDACTONE ) 25 MG tablet    2. How are you currently taking this medication (dosage and times per day)? 1 Tablet twice a day  3. Are you having a reaction (difficulty breathing--STAT)? No   4. What is your medication issue? Pt needs a refill but not sure what to do because medication is not working

## 2024-04-24 ENCOUNTER — Ambulatory Visit: Payer: Self-pay | Admitting: Cardiology

## 2024-04-24 ENCOUNTER — Other Ambulatory Visit (HOSPITAL_COMMUNITY)
Admission: RE | Admit: 2024-04-24 | Discharge: 2024-04-24 | Disposition: A | Discharge status: Home / Self Care | Source: Ambulatory Visit | Attending: Cardiology | Admitting: Cardiology

## 2024-04-24 ENCOUNTER — Telehealth: Payer: Self-pay | Admitting: Cardiology

## 2024-04-24 DIAGNOSIS — Z79899 Other long term (current) drug therapy: Secondary | ICD-10-CM | POA: Diagnosis present

## 2024-04-24 DIAGNOSIS — I10 Essential (primary) hypertension: Secondary | ICD-10-CM | POA: Insufficient documentation

## 2024-04-24 LAB — BASIC METABOLIC PANEL WITH GFR
Anion gap: 12 (ref 5–15)
BUN: 14 mg/dL (ref 8–23)
CO2: 28 mmol/L (ref 22–32)
Calcium: 8.1 mg/dL — ABNORMAL LOW (ref 8.9–10.3)
Chloride: 100 mmol/L (ref 98–111)
Creatinine, Ser: 1.12 mg/dL (ref 0.61–1.24)
GFR, Estimated: >60 mL/min (ref >60)
Glucose, Bld: 164 mg/dL — ABNORMAL HIGH (ref 70–99)
Potassium: 3.4 mmol/L — ABNORMAL LOW (ref 3.5–5.1)
Sodium: 140 mmol/L (ref 135–145)

## 2024-04-24 LAB — MAGNESIUM: Magnesium: 0.9 mg/dL — CL (ref 1.7–2.4)

## 2024-04-24 MED ORDER — MAGNESIUM OXIDE (ELEMENTAL) 400 MG PO TABS: 400.0000 mg | ORAL_TABLET | Freq: Two times a day (BID) | ORAL | 11 refills | Status: AC

## 2024-04-24 MED ORDER — POTASSIUM CHLORIDE CRYS ER 20 MEQ PO TBCR: 40.0000 meq | EXTENDED_RELEASE_TABLET | Freq: Every day | ORAL | 3 refills | Status: AC

## 2024-04-24 NOTE — Telephone Encounter (Addendum)
 Received a call from Jaleesa from the [facility] stating that pt has a critical Mg of 0.9. Will notify Dr. Amanda Jungling.

## 2024-04-24 NOTE — Telephone Encounter (Signed)
Critical labs results. Please advise

## 2024-04-24 NOTE — Telephone Encounter (Signed)
 Magnesium  and potassium are low, this is due to the fluid pill. Please start magnesium  oxide 400mg  bid. Start potassium chloirde 40mEq daily. Repeat bmet/mg in 1 week please   Letta Raw MD  Pt notified and orders placed

## 2024-04-24 NOTE — Telephone Encounter (Signed)
-----   Message from [place] sent at 04/24/2024 12:40 PM EDT ----- Magnesium  and potassium are low, this is due to the fluid pill. Please start magnesium  oxide 400mg  bid. Start potassium chloirde 40mEq daily. Repeat bmet/mg in 1 week please  Letta Raw MD

## 2024-05-02 ENCOUNTER — Telehealth: Payer: Self-pay | Admitting: Cardiology

## 2024-05-02 NOTE — Telephone Encounter (Signed)
 FYI: Gemma Kelp (stepdaughter) called wanting to let Dr. Amanda Jungling know that Mr. Ridley passed out today. He hit a cement statue and busted his head open above his good eye. His BP was very low. He has been admitted to [facility]

## 2024-05-05 ENCOUNTER — Telehealth: Payer: Self-pay | Admitting: Cardiology

## 2024-05-05 ENCOUNTER — Other Ambulatory Visit: Payer: Self-pay

## 2024-05-05 DIAGNOSIS — I10 Essential (primary) hypertension: Secondary | ICD-10-CM

## 2024-05-05 NOTE — Telephone Encounter (Signed)
Secure chat sent to Dr.Branch

## 2024-05-05 NOTE — Telephone Encounter (Signed)
 Dr. Bennie Brave wants a call back to consult with Dr. Amanda Jungling.  Theador Finer, RN notified.

## 2024-05-06 ENCOUNTER — Ambulatory Visit: Admitting: Cardiology

## 2024-05-09 ENCOUNTER — Encounter: Payer: Self-pay | Admitting: Cardiology

## 2024-05-09 ENCOUNTER — Ambulatory Visit: Attending: Cardiology | Admitting: Cardiology

## 2024-05-09 VITALS — BP 110/50 | HR 61 | Ht 73.0 in | Wt 238.8 lb

## 2024-05-09 DIAGNOSIS — R55 Syncope and collapse: Secondary | ICD-10-CM

## 2024-05-09 DIAGNOSIS — I1A0 Resistant hypertension: Secondary | ICD-10-CM

## 2024-05-09 DIAGNOSIS — I5032 Chronic diastolic (congestive) heart failure: Secondary | ICD-10-CM | POA: Diagnosis not present

## 2024-05-09 NOTE — Progress Notes (Signed)
 Clinical Summary Christian Carrillo is a 73 y.o.male seen today for follow up of the following medical problems.    1. LE edema/Chronic diastolic HF - 02/15/17 echo at [facility]: LVEF >65%, grade I diastolic dysfunction, normal LA, normal RV, possible PFO  12/2023 labs: Cr 1.1 K 4.4 Pro BNP 910 TSH 3 01/2024 echo with pcp: LVEF 54%, grade I dd, normal RV, no significant valve pathology.  - last visit we stopped chlorthalidone , started lasix  40mg  daily   - weight did not decrease on lasix , ongoing LE edema. We titrated dosing over time to ultimately 80mg  in AM and 40mg  in PM. - recent admit with AKI, low bp's, syncope. Lasix  was stopped. Ongoing LE edema despite being hypovolemic, looks to have a component of venous insufficiency related to his swelling as well.      2. HTN  - last visit chlorthalidone  was stopped and lasix  started for LE edema - toprol stopped due to low heart rates.  - home SBPs 190s primarily. We validated there home cuff with ours - of note  - olmesartan 40mg  in evenign. Clonidine 0.2mg  bid. On diltiazem  90mg  bid for history of severe esophageal spasm as opposed to a primary HTN cause.   -ongoing issues with HTN - recent admission with hypotension in setting of hypovolemia and AKI, lasix  was stopped.  - home bp's 160s-180s/70s-80s - olmesartan 40mg , clonidien 0.2mg  bid, diltiazem  90mg  bid, hydralazine  50mg  bid.    3. OSA  - he is on cpap, reports started cpap about 1 year ago - is not being followed by a sleep provider.    4. COPD - managed by pcp   5. History of hep C - followed by GI   6. Carotid stenosis - CTA 40-50% ICA stenosis Past Medical History:  Diagnosis Date   Anxiety    Arthritis    Chronic back pain    COPD (chronic obstructive pulmonary disease) (HCC)    Depression    GERD (gastroesophageal reflux disease)    Hepatitis    Hypertension    Localized edema    Other emphysema (HCC)    Shortness of breath dyspnea      Allergies   Allergen Reactions   Codeine     itching     Current Outpatient Medications  Medication Sig Dispense Refill   acetaminophen (TYLENOL) 500 MG tablet Take 1,000 mg by mouth every 6 (six) hours as needed for mild pain or moderate pain.     amitriptyline (ELAVIL) 10 MG tablet Take 1 tablet by mouth at bedtime.     cloNIDine (CATAPRES) 0.2 MG tablet Take 0.2 mg by mouth 2 (two) times daily.     diclofenac (VOLTAREN) 75 MG EC tablet Take 75 mg by mouth 2 (two) times daily.     dicyclomine (BENTYL) 10 MG capsule Take 10 mg by mouth in the morning, at noon, in the evening, and at bedtime.     diltiazem  (CARDIZEM ) 60 MG tablet Take 1.5 tablets (90 mg total) by mouth 2 (two) times daily. 135 tablet 2   FLUoxetine (PROZAC) 20 MG capsule Take 20 mg by mouth daily.   0   furosemide  (LASIX ) 80 MG tablet Take 1 tablet (80 mg total) by mouth every morning. & 40 mg in the PM 135 tablet 1   hydrALAZINE  (APRESOLINE ) 25 MG tablet Take 1 tablet (25 mg total) by mouth 2 (two) times daily. 180 tablet 1   magnesium  oxide (MAG-OX) 400 (240 Mg) MG  tablet Take 400 mg by mouth 2 (two) times daily.     Magnesium  Oxide, Elemental, 400 MG TABS Take 400 mg by mouth 2 (two) times daily. 60 tablet 11   olmesartan (BENICAR) 40 MG tablet Take 40 mg by mouth daily.     omeprazole  (PRILOSEC) 40 MG capsule Take 40 mg by mouth daily.     potassium chloride  SA (KLOR-CON  M20) 20 MEQ tablet Take 2 tablets (40 mEq total) by mouth daily. 180 tablet 3   rosuvastatin (CRESTOR) 5 MG tablet Take 5 mg by mouth daily.     spironolactone  (ALDACTONE ) 25 MG tablet Take 1 tablet (25 mg total) by mouth daily. 90 tablet 3   tamsulosin (FLOMAX) 0.4 MG CAPS capsule Take 0.4 mg by mouth at bedtime.     traZODone (DESYREL) 100 MG tablet Take 100 mg by mouth at bedtime.     No current facility-administered medications for this visit.     Past Surgical History:  Procedure Laterality Date   BACK SURGERY     BIOPSY  02/18/2020   Procedure:  BIOPSY;  Surgeon: Ruby Corporal, MD;  [facility];  Service: Endoscopy;;  gastric   BIOPSY  04/13/2023   Procedure: BIOPSY;  Surgeon: Urban Garden, MD;  [facility];  Service: Gastroenterology;;   cataract surgery     x 1 eye   COLONOSCOPY WITH PROPOFOL  N/A 12/07/2021   Procedure: COLONOSCOPY WITH PROPOFOL ;  Surgeon: Urban Garden, MD;  [facility];  Service: Gastroenterology;  Laterality: N/A;  9:45   COLONOSCOPY WITH PROPOFOL  N/A 04/13/2023   Procedure: COLONOSCOPY WITH PROPOFOL ;  Surgeon: Urban Garden, MD;  [facility];  Service: Gastroenterology;  Laterality: N/A;  9:45am;ASA 2   ESOPHAGEAL DILATION N/A 02/18/2020   Procedure: ESOPHAGEAL DILATION;  Surgeon: Ruby Corporal, MD;  [facility];  Service: Endoscopy;  Laterality: N/A;   ESOPHAGOGASTRODUODENOSCOPY N/A 05/03/2016   Procedure: ESOPHAGOGASTRODUODENOSCOPY (EGD);  Surgeon: Ruby Corporal, MD;  [facility];  Service: Endoscopy;  Laterality: N/A;  1:00 - moved to 6/7 @ 7:30 - Ann notified pt   ESOPHAGOGASTRODUODENOSCOPY N/A 02/18/2020   Procedure: ESOPHAGOGASTRODUODENOSCOPY (EGD);  Surgeon: Ruby Corporal, MD;  [facility];  Service: Endoscopy;  Laterality: N/A;  240   ESOPHAGOGASTRODUODENOSCOPY (EGD) WITH PROPOFOL  N/A 04/13/2023   Procedure: ESOPHAGOGASTRODUODENOSCOPY (EGD) WITH PROPOFOL ;  Surgeon: Urban Garden, MD;  [facility];  Service: Gastroenterology;  Laterality: N/A;   POLYPECTOMY  12/07/2021   Procedure: POLYPECTOMY;  Surgeon: Urban Garden, MD;  [facility];  Service: Gastroenterology;;   POLYPECTOMY  04/13/2023   Procedure: POLYPECTOMY;  Surgeon: Umberto Ganong, Bearl Limes, MD;  [facility];  Service: Gastroenterology;;   rt femur fx     traction   rt shoulder surgery pinning.        Allergies  Allergen Reactions   Codeine     itching       Family History  Problem Relation Age of Onset   Obesity Mother    Hypothyroidism Mother    Diabetes Mellitus II Mother    Cancer Father    Heart attack Brother    Diabetes Mellitus II Brother      Social History Christian Carrillo reports that he has been smoking cigarettes. He started smoking about 38 years ago. He has a 30 pack-year smoking history. He has never used smokeless tobacco. Christian Carrillo reports no history  of alcohol  use.    Physical Examination Today's Vitals   05/09/24 1359  BP: (!) 110/50  Pulse: 61  SpO2: 94%  Weight: 238 lb 12.8 oz (108.3 kg)  Height: 6' 1 (1.854 m)  PainSc: 6   PainLoc: Generalized   Body mass index is 31.51 kg/m.  Gen: resting comfortably, no acute distress HEENT: no scleral icterus, pupils equal round and reactive, no palptable cervical adenopathy,  CV: RRR, no m/rg, no jvd Resp: Clear to auscultation bilaterally GI: abdomen is soft, non-tender, non-distended, normal bowel sounds, no hepatosplenomegaly MSK: extremities are warm, no edema.  Skin: warm, no rash Neuro:  no focal deficits Psych: appropriate affect   Diagnostic Studies  02/2024 CTA [facility] . Intracranial circulation: Negative examination.  2. Extracranial circulation: Left carotid system: 40-50% left ICA origin stenosis.      Assessment and Plan  1. Chronic diasotlic heart failure - off diuretics, recent admission with AKI and hypovolemia.  - LE edema misleading regarding volume status, looks to have component of venous insufficiency. Given order for compression stockings - hold diuretic at this time, may start back at lower dose at f/u - may consider SGLT2i in near future.      2. Resistant HTN - labile bp' have been difficult to manage - home numbers elevated but at goal in clinic today. We have validated his home cuff with our clinic cuff in the past - recent multiple changes in regimen during admission, continue current regimen for now.  - of note  diltiazem  is for history of sever esophageal spasm - check renal artery US . Aldactone  just stopped 05/06/24, check renin/aldo in roughly 4 weeks. He is has OSA on CPAP. - refer to HTN clinic for resistant labile HTN.      3. Syncope - recent episode in setting of dehydration, hypovolemia. Sounds like also a vagal component as well - no recurrent episodes, currently off diuretic.  - pcp has arranged monitor, had some low heart rates initially at EMS eval but likely vagal mediated. Question of transient afib by EMS but not noted during admission, f/u monitor.      Laurann Pollock, M.D

## 2024-05-09 NOTE — Patient Instructions (Signed)
 Medication Instructions:  Your physician recommends that you continue on your current medications as directed. Please refer to the Current Medication list given to you today.  *If you need a refill on your cardiac medications before your next appointment, please call your pharmacy*  Lab Work: NONE   If you have labs (blood work) drawn today and your tests are completely normal, you will receive your results only by: MyChart Message (if you have MyChart) OR A paper copy in the mail If you have any lab test that is abnormal or we need to change your treatment, we will call you to review the results.  Testing/Procedures: Your physician has requested that you have a renal artery duplex. During this test, an ultrasound is used to evaluate blood flow to the kidneys. Allow one hour for this exam. Do not eat after midnight the day before and avoid carbonated beverages. Take your medications as you usually do.   Follow-Up: At [facility], you and your health needs are our priority.  As part of our continuing mission to provide you with exceptional heart care, our providers are all part of one team.  This team includes your primary Cardiologist (physician) and Advanced Practice Providers or APPs (Physician Assistants and Nurse Practitioners) who all work together to provide you with the care you need, when you need it.  Your next appointment:   4 week(s)  Provider:   Armida Lander, MD    We recommend signing up for the patient portal called MyChart.  Sign up information is provided on this After Visit Summary.  MyChart is used to connect with patients for Virtual Visits (Telemedicine).  Patients are able to view lab/test results, encounter notes, upcoming appointments, etc.  Non-urgent messages can be sent to your provider as well.   To learn more about what you can do with MyChart, go to [email or web address].   Other Instructions Thank you for choosing [facility]!

## 2024-05-15 ENCOUNTER — Telehealth: Payer: Self-pay | Admitting: Cardiology

## 2024-05-15 NOTE — Telephone Encounter (Signed)
 Pt c/o swelling/edema: STAT if pt has developed SOB within 24 hours  If swelling, where is the swelling located? Thighs tnd bottom of his legs Ankles look like that are gong to explode    How much weight have you gained and in what time span? 2 LBS per day   Have you gained 2 pounds in a day or 5 pounds in a week? Yes   Do you have a log of your daily weights (if so, list)? no  Are you currently taking a fluid pill? No    Are you currently SOB? Yes - Has COPD also  Have you traveled recently in a car or plane for an extended period of time?  No   BP 199/89 @ 8OO am

## 2024-05-15 NOTE — Telephone Encounter (Signed)
 New Message::     Hattie Lints, NP wants to talk to Dr Amanda Jungling, about this patient.

## 2024-05-15 NOTE — Telephone Encounter (Signed)
 Spoke with Pt and wife and pt in regards to swelling and SOB. Wife reports that the swelling started on yesterday. Pt has an appt with the PA in his PCP office on today at 1pm. Pt also c/o elevated BP's. Current bp are as follows:  Today 241 lbs BP 199/89 Hr 72  6/18 237.2 lbs BP 196/99  6/17 237 lbs BP 186/83  6/15 229 lbs BP 201/96 Hr 59  Wife repots that yesterday pt had increased SOB. Pt has to stop and sit when walking. No chest pain or syncope. She reports that pt had decreased number of voids last night. Please advise.

## 2024-05-15 NOTE — Telephone Encounter (Signed)
 Will need to start back some lasix , not as strong of a dose as he had before but does require at least some lasix  to manage fluid. Please start lasix  40mg  daily. Verify taking hydralazine  50mg  bid and if so increase to 75mg  bid. UPdate us  on symptoms on Monday.  Letta Raw MD

## 2024-05-15 NOTE — Telephone Encounter (Signed)
 Spoke with Hattie Lints, NP from pt's PCP office. Hattie Lints, NP notified of Dr. Junita Oliva response of restarting Lasix  40 mg daily and increasing Hydralazine  to 75 mg two times daily. Christian Carrillo stated that she will restart Lasix  at 20 mg at breakfast and 20 mg at lunch daily. Pt's bp in office is 126/56 manually. She will hold off on increasing Hydralazine  to 75 mg BID at this time. Pt will follow up with Dr. Amanda Jungling on 06/10/24.

## 2024-05-21 ENCOUNTER — Ambulatory Visit: Admitting: Primary Care

## 2024-05-21 ENCOUNTER — Encounter: Payer: Self-pay | Admitting: Primary Care

## 2024-06-04 ENCOUNTER — Ambulatory Visit: Admitting: Cardiology

## 2024-06-04 NOTE — Progress Notes (Deleted)
 Clinical Summary Mr. Selley is a 73 y.o.male  seen today for follow up of the following medical problems.    1. LE edema/Chronic diastolic HF - 02/15/17 echo at [facility]: LVEF >65%, grade I diastolic dysfunction, normal LA, normal RV, possible PFO  12/2023 labs: Cr 1.1 K 4.4 Pro BNP 910 TSH 3 01/2024 echo with pcp: LVEF 54%, grade I dd, normal RV, no significant valve pathology.  - last visit we stopped chlorthalidone , started lasix  40mg  daily     - weight did not decrease on lasix , ongoing LE edema. We titrated dosing over time to ultimately 80mg  in AM and 40mg  in PM. - recent admit with AKI, low bp's, syncope. Lasix  was stopped. Ongoing LE edema despite being hypovolemic, looks to have a component of venous insufficiency related to his swelling as well.        2. HTN  - last visit chlorthalidone  was stopped and lasix  started for LE edema - toprol stopped due to low heart rates.  - home SBPs 190s primarily. We validated there home cuff with ours - of note  - olmesartan 40mg  in evenign. Clonidine 0.2mg  bid. On diltiazem  90mg  bid for history of severe esophageal spasm as opposed to a primary HTN cause.    -ongoing issues with HTN - recent admission with hypotension in setting of hypovolemia and AKI, lasix  was stopped.  - home bp's 160s-180s/70s-80s - olmesartan 40mg , clonidien 0.2mg  bid, diltiazem  90mg  bid, hydralazine  50mg  bid.     3. OSA  - he is on cpap, reports started cpap about 1 year ago - is not being followed by a sleep provider.    4. COPD - managed by pcp   5. History of hep C - followed by GI   6. Carotid stenosis - CTA 40-50% ICA stenosis Past Medical History:  Diagnosis Date   Anxiety    Arthritis    Chronic back pain    COPD (chronic obstructive pulmonary disease) (HCC)    Depression    GERD (gastroesophageal reflux disease)    Hepatitis    Hypertension    Localized edema    Other emphysema (HCC)    Shortness of breath dyspnea       Allergies  Allergen Reactions   Codeine     itching     Current Outpatient Medications  Medication Sig Dispense Refill   acetaminophen (TYLENOL) 500 MG tablet Take 1,000 mg by mouth every 6 (six) hours as needed for mild pain or moderate pain.     amitriptyline (ELAVIL) 10 MG tablet Take 1 tablet by mouth at bedtime. (Patient not taking: Reported on 05/09/2024)     amoxicillin-clavulanate (AUGMENTIN) 875-125 MG tablet Take 1 tablet by mouth 2 (two) times daily.     aspirin  EC 81 MG tablet Take 81 mg by mouth daily. Swallow whole.     cloNIDine (CATAPRES) 0.2 MG tablet Take 0.2 mg by mouth 2 (two) times daily.     diclofenac (VOLTAREN) 75 MG EC tablet Take 75 mg by mouth 2 (two) times daily.     dicyclomine (BENTYL) 10 MG capsule Take 10 mg by mouth in the morning, at noon, in the evening, and at bedtime. (Patient not taking: Reported on 05/09/2024)     diltiazem  (CARDIZEM ) 60 MG tablet Take 1.5 tablets (90 mg total) by mouth 2 (two) times daily. 135 tablet 2   FLUoxetine (PROZAC) 20 MG capsule Take 20 mg by mouth daily.   0   fluticasone (  FLONASE) 50 MCG/ACT nasal spray 2 sprays.     furosemide  (LASIX ) 80 MG tablet Take 1 tablet (80 mg total) by mouth every morning. & 40 mg in the PM (Patient not taking: Reported on 05/09/2024) 135 tablet 1   hydrALAZINE  (APRESOLINE ) 25 MG tablet Take 1 tablet (25 mg total) by mouth 2 (two) times daily. 180 tablet 1   magnesium  oxide (MAG-OX) 400 (240 Mg) MG tablet Take 400 mg by mouth 2 (two) times daily. (Patient not taking: Reported on 05/09/2024)     Magnesium  Oxide, Elemental, 400 MG TABS Take 400 mg by mouth 2 (two) times daily. 60 tablet 11   olmesartan (BENICAR) 40 MG tablet Take 40 mg by mouth daily.     omeprazole  (PRILOSEC) 40 MG capsule Take 40 mg by mouth daily.     potassium chloride  SA (KLOR-CON  M20) 20 MEQ tablet Take 2 tablets (40 mEq total) by mouth daily. (Patient not taking: Reported on 05/09/2024) 180 tablet 3   rosuvastatin  (CRESTOR) 5 MG tablet Take 5 mg by mouth daily.     spironolactone  (ALDACTONE ) 25 MG tablet Take 1 tablet (25 mg total) by mouth daily. (Patient not taking: Reported on 05/09/2024) 90 tablet 3   tamsulosin (FLOMAX) 0.4 MG CAPS capsule Take 0.4 mg by mouth at bedtime.     traZODone (DESYREL) 100 MG tablet Take 100 mg by mouth at bedtime.     No current facility-administered medications for this visit.     Past Surgical History:  Procedure Laterality Date   BACK SURGERY     BIOPSY  02/18/2020   Procedure: BIOPSY;  Surgeon: Golda Claudis PENNER, MD;  [facility];  Service: Endoscopy;;  gastric   BIOPSY  04/13/2023   Procedure: BIOPSY;  Surgeon: Eartha Angelia Sieving, MD;  [facility]: [facility];  Service: Gastroenterology;;   cataract surgery     x 1 eye   COLONOSCOPY WITH PROPOFOL  N/A 12/07/2021   Procedure: COLONOSCOPY WITH PROPOFOL ;  Surgeon: Eartha Angelia Sieving, MD;  [facility]: [facility];  Service: Gastroenterology;  Laterality: N/A;  9:45   COLONOSCOPY WITH PROPOFOL  N/A 04/13/2023   Procedure: COLONOSCOPY WITH PROPOFOL ;  Surgeon: Eartha Angelia Sieving, MD;  [facility];  Service: Gastroenterology;  Laterality: N/A;  9:45am;ASA 2   ESOPHAGEAL DILATION N/A 02/18/2020   Procedure: ESOPHAGEAL DILATION;  Surgeon: Golda Claudis PENNER, MD;  [facility]: [facility];  Service: Endoscopy;  Laterality: N/A;   ESOPHAGOGASTRODUODENOSCOPY N/A 05/03/2016   Procedure: ESOPHAGOGASTRODUODENOSCOPY (EGD);  Surgeon: Claudis PENNER Golda, MD;  [facility]: [facility];  Service: Endoscopy;  Laterality: N/A;  1:00 - moved to 6/7 @ 7:30 - Ann notified pt   ESOPHAGOGASTRODUODENOSCOPY N/A 02/18/2020   Procedure: ESOPHAGOGASTRODUODENOSCOPY (EGD);  Surgeon: Golda Claudis PENNER, MD;  [facility];  Service: Endoscopy;  Laterality: N/A;  240   ESOPHAGOGASTRODUODENOSCOPY (EGD) WITH PROPOFOL  N/A 04/13/2023   Procedure: ESOPHAGOGASTRODUODENOSCOPY (EGD) WITH PROPOFOL ;  Surgeon: Eartha Angelia Sieving, MD;  [facility]: [facility];  Service: Gastroenterology;  Laterality: N/A;   POLYPECTOMY  12/07/2021   Procedure: POLYPECTOMY;  Surgeon: Eartha Angelia Sieving, MD;  [facility];  Service: Gastroenterology;;   POLYPECTOMY  04/13/2023   Procedure: POLYPECTOMY;  Surgeon: Eartha Angelia, Sieving, MD;  [facility]: [facility];  Service: Gastroenterology;;   rt femur fx     traction   rt shoulder surgery pinning.        Allergies  Allergen Reactions   Codeine  itching      Family History  Problem Relation Age of Onset   Obesity Mother    Hypothyroidism Mother    Diabetes Mellitus II Mother    Cancer Father    Heart attack Brother    Diabetes Mellitus II Brother      Social History Mr. Brakebill reports that he has been smoking cigarettes. He started smoking about 38 years ago. He has a 30 pack-year smoking history. He has never used smokeless tobacco. Mr. Heady reports no history of alcohol  use.   Review of Systems CONSTITUTIONAL: No weight loss, fever, chills, weakness or fatigue.  HEENT: Eyes: No visual loss, blurred vision, double vision or yellow sclerae.No hearing loss, sneezing, congestion, runny nose or sore throat.  SKIN: No rash or itching.  CARDIOVASCULAR:  RESPIRATORY: No shortness of breath, cough or sputum.  GASTROINTESTINAL: No anorexia, nausea, vomiting or diarrhea. No abdominal pain or blood.  GENITOURINARY: No burning on urination, no polyuria NEUROLOGICAL: No headache, dizziness, syncope, paralysis, ataxia, numbness or tingling in the extremities. No change in bowel or bladder control.  MUSCULOSKELETAL: No muscle, back pain, joint pain or stiffness.  LYMPHATICS: No enlarged nodes. No history of splenectomy.  PSYCHIATRIC: No history of depression or anxiety.  ENDOCRINOLOGIC: No reports of sweating, cold or heat intolerance. No polyuria or polydipsia.  SABRA   Physical Examination There were no vitals filed for this  visit. There were no vitals filed for this visit.  Gen: resting comfortably, no acute distress HEENT: no scleral icterus, pupils equal round and reactive, no palptable cervical adenopathy,  CV Resp: Clear to auscultation bilaterally GI: abdomen is soft, non-tender, non-distended, normal bowel sounds, no hepatosplenomegaly MSK: extremities are warm, no edema.  Skin: warm, no rash Neuro:  no focal deficits Psych: appropriate affect   Diagnostic Studies     Assessment and Plan   1. Chronic diasotlic heart failure - off diuretics, recent admission with AKI and hypovolemia.  - LE edema misleading regarding volume status, looks to have component of venous insufficiency. Given order for compression stockings - hold diuretic at this time, may start back at lower dose at f/u - may consider SGLT2i in near future.      2. Resistant HTN - labile bp' have been difficult to manage - home numbers elevated but at goal in clinic today. We have validated his home cuff with our clinic cuff in the past - recent multiple changes in regimen during admission, continue current regimen for now.  - of note diltiazem  is for history of sever esophageal spasm - check renal artery US . Aldactone  just stopped 05/06/24, check renin/aldo in roughly 4 weeks. He is has OSA on CPAP. - refer to HTN clinic for resistant labile HTN.        3. Syncope - recent episode in setting of dehydration, hypovolemia. Sounds like also a vagal component as well - no recurrent episodes, currently off diuretic.  - pcp has arranged monitor, had some low heart rates initially at EMS eval but likely vagal mediated. Question of transient afib by EMS but not noted during admission, f/u monitor.              Dorn PHEBE Ross, M.D., F.A.C.C.

## 2024-06-05 ENCOUNTER — Ambulatory Visit (HOSPITAL_COMMUNITY)
Admission: RE | Admit: 2024-06-05 | Discharge: 2024-06-05 | Disposition: A | Discharge status: Home / Self Care | Source: Ambulatory Visit | Attending: Vascular Surgery | Admitting: Vascular Surgery

## 2024-06-05 DIAGNOSIS — I10 Essential (primary) hypertension: Secondary | ICD-10-CM

## 2024-06-05 DIAGNOSIS — I1A0 Resistant hypertension: Secondary | ICD-10-CM | POA: Insufficient documentation

## 2024-06-09 ENCOUNTER — Ambulatory Visit: Payer: Self-pay

## 2024-06-09 ENCOUNTER — Telehealth: Payer: Self-pay | Admitting: Cardiology

## 2024-06-09 NOTE — Telephone Encounter (Signed)
 I spoke with wife and his repeat BP is 145/57 which is monitored thru Dr.Shah's office.  They have follow up appointment tomorrow with Kerry chester office at 2:20 pm

## 2024-06-09 NOTE — Telephone Encounter (Signed)
 Pt c/o BP issue: STAT if pt c/o blurred vision, one-sided weakness or slurred speech  1. What are your last 5 BP readings? 204/90 last night and 193/88 this morning.   2. Are you having any other symptoms (ex. Dizziness, headache, blurred vision, passed out)? Felling very bloated, swelling and some SOB.   3. What is your BP issue? Pt's spouse is concerned with pt BP, swelling and some SOB.   Pt c/o swelling: STAT is pt has developed SOB within 24 hours  How much weight have you gained and in what time span? 8 lbs within a week or so   If swelling, where is the swelling located? Hands, above his knees and ankles   Are you currently taking a fluid pill? Yes, twice a day   Are you currently SOB? Yes a little.   Do you have a log of your daily weights (if so, list)? No  Have you gained 3 pounds in a day or 5 pounds in a week? Yes  Have you traveled recently? No  Pt c/o Shortness Of Breath: STAT if SOB developed within the last 24 hours or pt is noticeably SOB on the phone  1. Are you currently SOB (can you hear that pt is SOB on the phone)? Pt not on the line but spouse stated a little   2. How long have you been experiencing SOB? Over the weekend it's gotten worse  3. Are you SOB when sitting or when up moving around? Both   4. Are you currently experiencing any other symptoms? Coughing.

## 2024-06-09 NOTE — Telephone Encounter (Signed)
   FYI Only or Action Required?: FYI only for provider.  Patient was last seen in primary care on N/A.  Called Nurse Triage reporting Hypertension.  Symptoms began CHRONIC PER WIFE.  Interventions attempted: Other: N/A.  Symptoms are: gradually worsening.  Triage Disposition: Go to ED Now (Notify PCP)  Patient/caregiver understands and will follow disposition?: No, refuses disposition                              Copied from CRM [id number]. Topic: Clinical - Red Word Triage >> Jun 09, 2024  9:46 AM Corean SAUNDERS wrote: Red Word that prompted transfer to Nurse Triage: Blood pressure over 200 Reason for Disposition . [1] Systolic BP >= 160 OR Diastolic >= 100 AND [2] cardiac (e.g., breathing difficulty, chest pain) or neurologic symptoms (e.g., new-onset blurred or double vision, unsteady gait)  Answer Assessment - Initial Assessment Questions Patient's wife was calling just to reschedule the New Patient Sleep Study with the [facility] Pulmonary Office Patient is experiencing high blood pressures and some increased shortness of breath lately per his wife Wife was advised that based on these symptoms the ER is recommended. She states that these issues have been going on a long time and they have an appointment tomorrow with his Cardiologist. Patient states that he feels swollen---he advised that he does have issues retaining fluid--He is advised that the ER is recommended based on his symptoms at this time as well but he states that they are going to call the Cardiologist first He and his wife both state that they are going to the call the Cardiologist first and then go from there based on their recommendations. They will call back after speaking with the Cardiologist to reschedule this New Patient Sleep Study They are advised that if anything gets worse to go to the Emergency Room. They verbalized understanding.    Seeing PCP and Cardiology  Yesterday  morning bp was 206/87 and weight was 233  Bp 204/90 and weight was 237 at bedtime last night  Today weight was 233 and bp 193/88 at 7:30 AM Patient's wife states that these numbers are sent to his providers.   Hx of COPD and trouble breathing history ---out of breath yesterday at church  Appt with Cardiologist tomorrow  Still wearing a heart monitor Saw a kidney specialist 06/05/2024  Protocols used: Blood Pressure - High-A-AH

## 2024-06-10 ENCOUNTER — Other Ambulatory Visit (HOSPITAL_COMMUNITY)
Admission: RE | Admit: 2024-06-10 | Discharge: 2024-06-10 | Disposition: A | Discharge status: Home / Self Care | Source: Ambulatory Visit | Attending: Cardiology | Admitting: Cardiology

## 2024-06-10 ENCOUNTER — Ambulatory Visit: Admitting: Cardiology

## 2024-06-10 ENCOUNTER — Ambulatory Visit: Attending: Cardiology | Admitting: Cardiology

## 2024-06-10 ENCOUNTER — Encounter: Payer: Self-pay | Admitting: Cardiology

## 2024-06-10 VITALS — BP 160/70 | HR 60 | Ht 73.0 in | Wt 240.0 lb

## 2024-06-10 DIAGNOSIS — I5032 Chronic diastolic (congestive) heart failure: Secondary | ICD-10-CM | POA: Diagnosis not present

## 2024-06-10 DIAGNOSIS — I1A0 Resistant hypertension: Secondary | ICD-10-CM | POA: Insufficient documentation

## 2024-06-10 DIAGNOSIS — Z79899 Other long term (current) drug therapy: Secondary | ICD-10-CM

## 2024-06-10 LAB — BASIC METABOLIC PANEL WITH GFR
Anion gap: 13 (ref 5–15)
BUN: 14 mg/dL (ref 8–23)
CO2: 25 mmol/L (ref 22–32)
Calcium: 8.7 mg/dL — ABNORMAL LOW (ref 8.9–10.3)
Chloride: 102 mmol/L (ref 98–111)
Creatinine, Ser: 1.31 mg/dL — ABNORMAL HIGH (ref 0.61–1.24)
GFR, Estimated: 59 mL/min — ABNORMAL LOW (ref >60)
Glucose, Bld: 114 mg/dL — ABNORMAL HIGH (ref 70–99)
Potassium: 3.7 mmol/L (ref 3.5–5.1)
Sodium: 140 mmol/L (ref 135–145)

## 2024-06-10 LAB — MAGNESIUM: Magnesium: 1.6 mg/dL — ABNORMAL LOW (ref 1.7–2.4)

## 2024-06-10 MED ORDER — HYDRALAZINE HCL 50 MG PO TABS: 75.0000 mg | ORAL_TABLET | Freq: Two times a day (BID) | ORAL | 11 refills | Status: DC

## 2024-06-10 MED ORDER — OLMESARTAN MEDOXOMIL 40 MG PO TABS: 40.0000 mg | ORAL_TABLET | Freq: Every day | ORAL | 3 refills | Status: AC

## 2024-06-10 MED ORDER — FUROSEMIDE 20 MG PO TABS: ORAL_TABLET | ORAL | 11 refills | Status: AC

## 2024-06-10 NOTE — Patient Instructions (Signed)
 Medication Instructions:  Your physician has recommended you make the following change in your medication:   Increase Lasix  to 40 mg in the AM and 20 mg in the PM  Increase Hydralazine  to 75 mg Two Times Daily   Monitor Blood pressure and record readings for 1 week.   *If you need a refill on your cardiac medications before your next appointment, please call your pharmacy*  Lab Work: Your physician recommends that you return for lab work in: Today   If you have labs (blood work) drawn today and your tests are completely normal, you will receive your results only by: MyChart Message (if you have MyChart) OR A paper copy in the mail If you have any lab test that is abnormal or we need to change your treatment, we will call you to review the results.  Testing/Procedures: NONE   Follow-Up: At [facility], you and your health needs are our priority.  As part of our continuing mission to provide you with exceptional heart care, our providers are all part of one team.  This team includes your primary Cardiologist (physician) and Advanced Practice Providers or APPs (Physician Assistants and Nurse Practitioners) who all work together to provide you with the care you need, when you need it.  Your next appointment:   6 week(s)  Provider:   You may see Alvan Carrier, MD or one of the following Advanced Practice Providers on your designated Care Team:   Laymon Qua, PA-C  Sc[place]ia [place], [place] Olivia Pavy, [place]     We recommend signing up for the patient portal called MyChart.  Sign up information is provided on this After Visit Summary.  MyChart is used to connect with patients for Virtual Visits (Telemedicine).  Patients are able to view lab/test results, encounter notes, upcoming appointments, etc.  Non-urgent messages can be sent to your provider as well.   To learn more about what you can do with MyChart, go to [email or web address].   Other Instructions Thank you  for choosing [facility]!

## 2024-06-10 NOTE — Progress Notes (Signed)
 Clinical Summary Christian Carrillo is a 73 y.o.male seen today for follow up of the following medical problems.    1. LE edema/Chronic diastolic HF - 02/15/17 echo at [facility]: LVEF >65%, grade I diastolic dysfunction, normal LA, normal RV, possible PFO  12/2023 labs: Cr 1.1 K 4.4 Pro BNP 910 TSH 3 01/2024 echo with pcp: LVEF 54%, grade I dd, normal RV, no significant valve pathology.  - we stopped chlorthalidone , started lasix   20mg  bid   - titrated lasix  over time due to ongoing LE edema - despite ongoing edema developed AKI and admitted with low bp's, hypovolemia, syncope - leg edema poor surrogate for his volume status, looks to be significant component of venous insufficiency as well.  - compression stockings were too expensive.    - did start back lasix  20mg  bid due to some increaseing DOE which is ongoing     2. HTN  - last visit chlorthalidone  was stopped and lasix  started for LE edema - toprol stopped due to low heart rates.  - We validated there home cuff with ours  - currently on olmesartan  40mg  daily, clonidine 0.2mg  bid (at some point started by another provider), diltiazem  90mg  bid ( which is also for severe history of esophageal spasm).  -aldactone  was stopped 04/2024 admission with AKI and hypovolemia.   05/2024 renal artery US : right 1-59%, left 1-59% stenosis - plasm meta pending - renin/aldo pending - has OSA, on cpap but has not been reevlauated in some time. Awaiting f/u with pulm - home bp's 180s-200s/70s-90s     Other medical problems not addressed this visit   3. OSA  - he is on cpap, reports started cpap about 1 year ago - is not being followed by a sleep provider.    4. COPD - managed by pcp   5. History of hep C - followed by GI   6. Carotid stenosis - CTA 40-50% ICA stenosis Past Medical History:  Diagnosis Date   Anxiety    Arthritis    Chronic back pain    COPD (chronic obstructive pulmonary disease) (HCC)    Depression    GERD  (gastroesophageal reflux disease)    Hepatitis    Hypertension    Localized edema    Other emphysema (HCC)    Shortness of breath dyspnea      Allergies  Allergen Reactions   Codeine     itching     Current Outpatient Medications  Medication Sig Dispense Refill   acetaminophen (TYLENOL) 500 MG tablet Take 1,000 mg by mouth every 6 (six) hours as needed for mild pain or moderate pain.     amitriptyline (ELAVIL) 10 MG tablet Take 1 tablet by mouth at bedtime.     aspirin  EC 81 MG tablet Take 81 mg by mouth daily. Swallow whole.     cloNIDine (CATAPRES) 0.2 MG tablet Take 0.2 mg by mouth 2 (two) times daily.     diclofenac (VOLTAREN) 75 MG EC tablet Take 75 mg by mouth 2 (two) times daily.     diltiazem  (CARDIZEM ) 60 MG tablet Take 1.5 tablets (90 mg total) by mouth 2 (two) times daily. 135 tablet 2   FLUoxetine (PROZAC) 20 MG capsule Take 20 mg by mouth daily.   0   furosemide  (LASIX ) 20 MG tablet Take 40 mg by mouth daily.     hydrALAZINE  (APRESOLINE ) 50 MG tablet Take 50 mg by mouth in the morning and at bedtime.  Magnesium  Oxide, Elemental, 400 MG TABS Take 400 mg by mouth 2 (two) times daily. 60 tablet 11   omeprazole  (PRILOSEC) 40 MG capsule Take 40 mg by mouth daily.     rosuvastatin (CRESTOR) 5 MG tablet Take 5 mg by mouth daily.     tamsulosin (FLOMAX) 0.4 MG CAPS capsule Take 0.4 mg by mouth at bedtime.     traZODone (DESYREL) 100 MG tablet Take 100 mg by mouth at bedtime.     amoxicillin-clavulanate (AUGMENTIN) 875-125 MG tablet Take 1 tablet by mouth 2 (two) times daily.     dicyclomine (BENTYL) 10 MG capsule Take 10 mg by mouth in the morning, at noon, in the evening, and at bedtime. (Patient not taking: Reported on 06/10/2024)     fluticasone (FLONASE) 50 MCG/ACT nasal spray 2 sprays. (Patient not taking: Reported on 06/10/2024)     magnesium  oxide (MAG-OX) 400 (240 Mg) MG tablet Take 400 mg by mouth 2 (two) times daily. (Patient not taking: Reported on 05/09/2024)      olmesartan  (BENICAR ) 40 MG tablet Take 40 mg by mouth daily. (Patient not taking: Reported on 06/10/2024)     potassium chloride  SA (KLOR-CON  M20) 20 MEQ tablet Take 2 tablets (40 mEq total) by mouth daily. (Patient not taking: Reported on 06/10/2024) 180 tablet 3   No current facility-administered medications for this visit.     Past Surgical History:  Procedure Laterality Date   BACK SURGERY     BIOPSY  02/18/2020   Procedure: BIOPSY;  Surgeon: Golda Claudis PENNER, MD;  [facility];  Service: Endoscopy;;  gastric   BIOPSY  04/13/2023   Procedure: BIOPSY;  Surgeon: Eartha Angelia Sieving, MD;  [facility];  Service: Gastroenterology;;   cataract surgery     x 1 eye   COLONOSCOPY WITH PROPOFOL  N/A 12/07/2021   Procedure: COLONOSCOPY WITH PROPOFOL ;  Surgeon: Eartha Angelia Sieving, MD;  [facility];  Service: Gastroenterology;  Laterality: N/A;  9:45   COLONOSCOPY WITH PROPOFOL  N/A 04/13/2023   Procedure: COLONOSCOPY WITH PROPOFOL ;  Surgeon: Eartha Angelia Sieving, MD;  [facility];  Service: Gastroenterology;  Laterality: N/A;  9:45am;ASA 2   ESOPHAGEAL DILATION N/A 02/18/2020   Procedure: ESOPHAGEAL DILATION;  Surgeon: Golda Claudis PENNER, MD;  [facility];  Service: Endoscopy;  Laterality: N/A;   ESOPHAGOGASTRODUODENOSCOPY N/A 05/03/2016   Procedure: ESOPHAGOGASTRODUODENOSCOPY (EGD);  Surgeon: Claudis PENNER Golda, MD;  [facility];  Service: Endoscopy;  Laterality: N/A;  1:00 - moved to 6/7 @ 7:30 - Ann notified pt   ESOPHAGOGASTRODUODENOSCOPY N/A 02/18/2020   Procedure: ESOPHAGOGASTRODUODENOSCOPY (EGD);  Surgeon: Golda Claudis PENNER, MD;  [facility];  Service: Endoscopy;  Laterality: N/A;  240   ESOPHAGOGASTRODUODENOSCOPY (EGD) WITH PROPOFOL  N/A 04/13/2023   Procedure: ESOPHAGOGASTRODUODENOSCOPY (EGD) WITH PROPOFOL ;  Surgeon: Eartha Angelia Sieving, MD;  [facility];  Service: Gastroenterology;   Laterality: N/A;   POLYPECTOMY  12/07/2021   Procedure: POLYPECTOMY;  Surgeon: Eartha Angelia Sieving, MD;  [facility];  Service: Gastroenterology;;   POLYPECTOMY  04/13/2023   Procedure: POLYPECTOMY;  Surgeon: Eartha Angelia, Sieving, MD;  [facility];  Service: Gastroenterology;;   rt femur fx     traction   rt shoulder surgery pinning.        Allergies  Allergen Reactions   Codeine     itching      Family History  Problem Relation Age of Onset  Obesity Mother    Hypothyroidism Mother    Diabetes Mellitus II Mother    Cancer Father    Heart attack Brother    Diabetes Mellitus II Brother      Social History Mr. Mohr reports that he has been smoking cigarettes. He started smoking about 38 years ago. He has a 30 pack-year smoking history. He has never used smokeless tobacco. Mr. Mcinturff reports no history of alcohol  use.   Physical Examination Today's Vitals   06/10/24 1435 06/10/24 1539  BP: (!) 159/63 (!) 160/70  Pulse: 60   SpO2: 95%   Weight: 240 lb (108.9 kg)   Height: 6' 1 (1.854 m)    Body mass index is 31.66 kg/m.  Gen: resting comfortably, no acute distress HEENT: no scleral icterus, pupils equal round and reactive, no palptable cervical adenopathy,  CV: RRR, no m/rg, o jvd Resp: faint crackles bilaterally GI: abdomen is soft, non-tender, non-distended, normal bowel sounds, no hepatosplenomegaly MSK: trace bilatera edema Skin: warm, no rash Neuro:  no focal deficits Psych: appropriate affect   Diagnostic Studies  02/2024 CTA [facility] . Intracranial circulation: Negative examination.  2. Extracranial circulation: Left carotid system: 40-50% left ICA origin stenosis.    Assessment and Plan   1. Chronic diasotlic heart failure - LE edema primarily due to venous insufficiency, poor gauge of volume status - some crackles on exam, has had increased DOE - careful diuretic dosing, 04/2024 admission with hypotension and  hypovolemia on lasix  - increase lasix  to 40mg  in AM and 20mg  in PM, recheck bmet/mg - may consider SGLT2i in near future.      2. Resistant HTN - labile bp' have been difficult to manage - renal artery US  was benign - some episodic patterns of bp spikes, will check plasma metanephrines - has been off aldactone  at least 4 weeks, check renin/aldo - has f/u for OSA evaluattion, on cpap and compliant but has not been adjusted in quite some time.   - of note diltiazem  is for history of severe esophageal spasm and not bp alone -increase hydralazine  to 75mg  bid. Update us  on home bp's 1 week    F/u 6 weeks    Dorn PHEBE Ross, M.D.

## 2024-06-12 ENCOUNTER — Ambulatory Visit: Payer: Self-pay | Admitting: Cardiology

## 2024-06-12 ENCOUNTER — Institutional Professional Consult (permissible substitution) (HOSPITAL_BASED_OUTPATIENT_CLINIC_OR_DEPARTMENT_OTHER): Admitting: Family

## 2024-06-14 LAB — METANEPHRINES, PLASMA
Metanephrine, Free: 29.3 pg/mL (ref 0.0–88.0)
Normetanephrine, Free: 91.1 pg/mL (ref 0.0–285.2)

## 2024-06-16 ENCOUNTER — Encounter: Payer: Self-pay | Admitting: Cardiology

## 2024-06-17 ENCOUNTER — Ambulatory Visit: Payer: Self-pay | Admitting: Cardiology

## 2024-06-17 ENCOUNTER — Telehealth: Payer: Self-pay | Admitting: Cardiology

## 2024-06-17 LAB — ALDOSTERONE + RENIN ACTIVITY W/ RATIO
ALDO / PRA Ratio: 3.5 (ref 0.0–30.0)
Aldosterone: 2.1 ng/dL (ref 0.0–30.0)
PRA LC/MS/MS: 0.596 ng/mL/h (ref 0.167–5.380)

## 2024-06-17 MED ORDER — HYDRALAZINE HCL 100 MG PO TABS: 100.0000 mg | ORAL_TABLET | Freq: Two times a day (BID) | ORAL | 2 refills | Status: AC

## 2024-06-17 NOTE — Telephone Encounter (Signed)
 Patient will increase hydralazine  to 100 mg bid and provide BP log in 1 week.

## 2024-06-17 NOTE — Telephone Encounter (Signed)
-----   Message from Alvan Carrier sent at 06/17/2024 10:06 AM EDT ----- BP's remain elevated, please increase hydralazine  to 100mg  bid and update us  again 1 week please  JINNY Alvan MD ----- Message ----- From: Delbra Krebs T Sent: 06/16/2024  11:16 AM EDT To: Carrier JULIANNA Alvan, MD

## 2024-06-17 NOTE — Telephone Encounter (Signed)
 Left message to return call asking him to call back in the morning after 8 am

## 2024-06-17 NOTE — Telephone Encounter (Signed)
 Pt called back in returning [place] call about his monitor  Best number is [phone]

## 2024-06-19 ENCOUNTER — Encounter: Payer: Self-pay | Admitting: *Deleted

## 2024-06-19 NOTE — Telephone Encounter (Signed)
 Patient states he is not sure why he called back/ He is taking increased losartan  dose as directed.

## 2024-06-23 ENCOUNTER — Encounter: Payer: Self-pay | Admitting: Cardiology

## 2024-06-26 ENCOUNTER — Ambulatory Visit (INDEPENDENT_AMBULATORY_CARE_PROVIDER_SITE_OTHER): Admitting: Gastroenterology

## 2024-07-04 ENCOUNTER — Encounter (HOSPITAL_BASED_OUTPATIENT_CLINIC_OR_DEPARTMENT_OTHER): Payer: Self-pay | Admitting: Family

## 2024-07-04 ENCOUNTER — Ambulatory Visit (HOSPITAL_BASED_OUTPATIENT_CLINIC_OR_DEPARTMENT_OTHER): Admitting: Family

## 2024-07-04 VITALS — BP 122/56 | HR 61 | Ht 72.0 in | Wt 240.0 lb

## 2024-07-04 DIAGNOSIS — M549 Dorsalgia, unspecified: Secondary | ICD-10-CM | POA: Diagnosis not present

## 2024-07-04 DIAGNOSIS — I10 Essential (primary) hypertension: Secondary | ICD-10-CM | POA: Diagnosis not present

## 2024-07-04 DIAGNOSIS — G8929 Other chronic pain: Secondary | ICD-10-CM

## 2024-07-04 DIAGNOSIS — E785 Hyperlipidemia, unspecified: Secondary | ICD-10-CM

## 2024-07-04 DIAGNOSIS — G4733 Obstructive sleep apnea (adult) (pediatric): Secondary | ICD-10-CM

## 2024-07-04 DIAGNOSIS — I5032 Chronic diastolic (congestive) heart failure: Secondary | ICD-10-CM

## 2024-07-04 DIAGNOSIS — I701 Atherosclerosis of renal artery: Secondary | ICD-10-CM

## 2024-07-04 NOTE — Patient Instructions (Addendum)
 Medication Instructions:  Change your timing of medication to be as close to 12 hours apart as possible. If you can, about 8am and 8pm.    Follow-Up: Please follow up in 2 months in [facility] with Dr. Raford, Reche Finder, NP or Allean Mink PharmD    Special Instructions:    Check blood pressure at least an hour after your medications and keep a log. We will call in 1 week to check in on your blood pressure.  If your blood pressure is till consistently high, we may consider adding a midday dose of Hydralazine .

## 2024-07-04 NOTE — Progress Notes (Signed)
 Advanced [facility]pertension [facility] Initial Assessment:    Date:  07/04/2024   ID:  Christian Carrillo, DOB [birth year], MRN [id number]  PCP:  Maree Isles, MD  Cardiologist:  Alvan Carrier, MD  Nephrologist:  Referring MD: Alvan Carrier JULIANNA, MD   CC: Hypertension  History of Present Illness:    Christian Carrillo is a 73 y.o. male with a hx of chronic diastolic heart failure, resistant hypertension, esophageal spasm, renal artery stenosis, OSA, COPD, hepatitis C, carotid stenosis here to establish care in the [facility].   On 06/17/24 Hydralazine  increased to 100mg  BID by Dr. Alvan.   Christian Carrillo was diagnosed with hypertension years ago after coming off of opioids which were prescribed for back pain which he self discontinued. It has been difficult to control. Blood pressure checked with arm cuff at home which the readings go to his primary care doctor. Readings have been labile. he reports tobacco use previously. For exercise he is limited by back pain as can only walk about 100 feet without pain. He does get injections in his back periodically with [facility].  he eats at home and outside of the home.  We discussed clonidine patch however unfortunately his insurance has a prescription deductible and it would cost approx $125 for three months and approx $53 thereafter which is cost prohibitive.   Take medications at 8 am and 11 pm. BP checked at home prior to medications with systolic readings most often 180-190s.   Previous antihypertensives: Spironolactone  - AKI Hydrochlorothiazide Chlorthalidone  - changed to Lasix  Losartan  - changed to olmesartan  Amlodipine - deferred due to use of Diltiazem  metoprolol   Past Medical History:  Diagnosis Date   Anxiety    Arthritis    Chronic back pain    COPD (chronic obstructive pulmonary disease) (HCC)    Depression    GERD (gastroesophageal reflux disease)    Hepatitis    Hypertension    Localized  edema    Other emphysema (HCC)    Shortness of breath dyspnea     Past Surgical History:  Procedure Laterality Date   BACK SURGERY     BIOPSY  02/18/2020   Procedure: BIOPSY;  Surgeon: Golda Claudis PENNER, MD;  [facility];  Service: Endoscopy;;  gastric   BIOPSY  04/13/2023   Procedure: BIOPSY;  Surgeon: Eartha Angelia Sieving, MD;  [facility];  Service: Gastroenterology;;   cataract surgery     x 1 eye   COLONOSCOPY WITH PROPOFOL  N/A 12/07/2021   Procedure: COLONOSCOPY WITH PROPOFOL ;  Surgeon: Eartha Angelia Sieving, MD;  [facility];  Service: Gastroenterology;  Laterality: N/A;  9:45   COLONOSCOPY WITH PROPOFOL  N/A 04/13/2023   Procedure: COLONOSCOPY WITH PROPOFOL ;  Surgeon: Eartha Angelia Sieving, MD;  [facility];  Service: Gastroenterology;  Laterality: N/A;  9:45am;ASA 2   ESOPHAGEAL DILATION N/A 02/18/2020   Procedure: ESOPHAGEAL DILATION;  Surgeon: Golda Claudis PENNER, MD;  [facility];  Service: Endoscopy;  Laterality: N/A;   ESOPHAGOGASTRODUODENOSCOPY N/A 05/03/2016   Procedure: ESOPHAGOGASTRODUODENOSCOPY (EGD);  Surgeon: Claudis PENNER Golda, MD;  [facility];  Service: Endoscopy;  Laterality: N/A;  1:00 - moved to 6/7 @ 7:30 - Ann notified pt   ESOPHAGOGASTRODUODENOSCOPY N/A 02/18/2020   Procedure: ESOPHAGOGASTRODUODENOSCOPY (EGD);  Surgeon: Golda Claudis PENNER, MD;  [facility];  Service: Endoscopy;  Laterality: N/A;  240   ESOPHAGOGASTRODUODENOSCOPY (EGD) WITH PROPOFOL  N/A 04/13/2023   Procedure:  ESOPHAGOGASTRODUODENOSCOPY (EGD) WITH PROPOFOL ;  Surgeon: Eartha Angelia Sieving, MD;  [facility];  Service: Gastroenterology;  Laterality: N/A;   POLYPECTOMY  12/07/2021   Procedure: POLYPECTOMY;  Surgeon: Eartha Angelia Sieving, MD;  [facility];  Service: Gastroenterology;;   POLYPECTOMY  04/13/2023   Procedure: POLYPECTOMY;  Surgeon: Eartha Angelia, Sieving, MD;  [facility];  Service: Gastroenterology;;   rt femur fx     traction   rt shoulder surgery pinning.       Current Medications: Current Meds  Medication Sig   aspirin  EC 81 MG tablet Take 81 mg by mouth daily. Swallow whole.   cloNIDine (CATAPRES) 0.2 MG tablet Take 0.2 mg by mouth 2 (two) times daily.   diclofenac (VOLTAREN) 75 MG EC tablet Take 75 mg by mouth 2 (two) times daily.   diltiazem  (CARDIZEM ) 60 MG tablet Take 1.5 tablets (90 mg total) by mouth 2 (two) times daily.   FLUoxetine (PROZAC) 20 MG capsule Take 20 mg by mouth daily.    furosemide  (LASIX ) 20 MG tablet Take 40 mg ( 2 Tablets) in the AM and Take 20 mg ( 1 Tablet ) in the PM   hydrALAZINE  (APRESOLINE ) 100 MG tablet Take 1 tablet (100 mg total) by mouth 2 (two) times daily.   Magnesium  Oxide, Elemental, 400 MG TABS Take 400 mg by mouth 2 (two) times daily.   olmesartan  (BENICAR ) 40 MG tablet Take 1 tablet (40 mg total) by mouth daily.   omeprazole  (PRILOSEC) 40 MG capsule Take 40 mg by mouth daily.   potassium chloride  SA (KLOR-CON  M20) 20 MEQ tablet Take 2 tablets (40 mEq total) by mouth daily.   rosuvastatin (CRESTOR) 5 MG tablet Take 5 mg by mouth daily.   tamsulosin (FLOMAX) 0.4 MG CAPS capsule Take 0.4 mg by mouth at bedtime.   traZODone (DESYREL) 100 MG tablet Take 100 mg by mouth at bedtime.     Allergies:   Codeine   Social History   Socioeconomic History   Marital status: Married    Spouse name: Not on file   Number of children: Not on file   Years of education: Not on file   Highest education level: Not on file  Occupational History   Not on file  Tobacco Use   Smoking status: Every Day    Current packs/day: 0.00    Average packs/day: 1 pack/day for 30.0 years (30.0 ttl pk-yrs)    Types: Cigarettes    Start date: 03/06/1986    Last attempt to quit: 03/06/2016    Years since quitting: 8.3   Smokeless tobacco: Never  Vaping Use   Vaping status: Never Used  Substance and Sexual Activity   Alcohol   use: No    Alcohol /week: 0.0 standard drinks of alcohol    Drug use: No   Sexual activity: Not on file  Other Topics Concern   Not on file  Social History Narrative   Not on file   Social Drivers of Health   Financial Resource Strain: Low Risk  (05/03/2024)   Received from [facility]   Overall Financial Resource Strain (CARDIA)    Difficulty of Paying Living Expenses: Not hard at all  Food Insecurity: No Food Insecurity (05/03/2024)   Received from [facility]   Hunger Vital Sign    Within the past 12 months, you worried that your food would run out before you got the money to buy more.: Never true    Within  the past 12 months, the food you bought just didn't last and you didn't have money to get more.: Never true  Transportation Needs: No Transportation Needs (05/03/2024)   Received from [facility] - Transportation    Lack of Transportation (Medical): No    Lack of Transportation (Non-Medical): No  Physical Activity: Inactive (05/02/2024)   Received from [facility]   Exercise Vital Sign    On average, how many days per week do you engage in moderate to strenuous exercise (like a brisk walk)?: 0 days    On average, how many minutes do you engage in exercise at this level?: 0 min  Stress: No Stress Concern Present (05/02/2024)   Received from [facility] - Occupational Stress Questionnaire    Feeling of Stress : Not at all  Social Connections: Socially Integrated (05/02/2024)   Received from [facility]   Social Connection and Isolation Panel    In a typical week, how many times do you talk on the phone with family, friends, or neighbors?: Three times a week    How often do you get together with friends or relatives?: Twice a week    How often do you attend church or religious services?: More than 4 times per year    Do you belong to any clubs or organizations such as church groups, unions, fraternal or athletic  groups, or school groups?: Yes    How often do you attend meetings of the clubs or organizations you belong to?: More than 4 times per year    Are you married, widowed, divorced, separated, never married, or living with a partner?: Married     Family History: The patient's family history includes Cancer in his father; Diabetes Mellitus II in his brother and mother; Heart attack in his brother; Hypothyroidism in his mother; Obesity in his mother.  ROS:   Please see the history of present illness.     All other systems reviewed and are negative.  EKGs/Labs/Other Studies Reviewed:         Recent Labs: 06/10/2024: BUN 14; Creatinine, Ser 1.31; Magnesium  1.6; Potassium 3.7; Sodium 140   Recent Lipid Panel    Component Value Date/Time   CHOL 221 (H) 05/22/2017 1003   TRIG 187 (H) 05/22/2017 1003   HDL 28 (L) 05/22/2017 1003   CHOLHDL 7.9 (H) 05/22/2017 1003   VLDL 37 (H) 05/22/2017 1003   LDLCALC 156 (H) 05/22/2017 1003    Physical Exam:   VS:  BP (!) 122/56   Pulse 61   Ht 6' (1.829 m)   Wt 240 lb (108.9 kg)   SpO2 95%   BMI 32.55 kg/m  , BMI Body mass index is 32.55 kg/m. GENERAL:  Well appearing HEENT: Pupils equal round and reactive, fundi not visualized, oral mucosa unremarkable NECK:  No jugular venous distention, waveform within normal limits, carotid upstroke brisk and symmetric, no bruits, no thyromegaly LYMPHATICS:  No cervical adenopathy LUNGS:  Clear to auscultation bilaterally HEART:  RRR.  PMI not displaced or sustained,S1 and S2 within normal limits, no S3, no S4, no clicks, no rubs, no murmurs ABD:  Flat, positive bowel sounds normal in frequency in pitch, no bruits, no rebound, no guarding, no midline pulsatile mass, no hepatomegaly, no splenomegaly EXT:  2 plus pulses throughout, no edema, no cyanosis no clubbing SKIN:  No rashes no nodules NEURO:  Cranial nerves II through XII grossly intact,  motor grossly intact throughout PSYCH:  Cognitively intact,  oriented to person place and time   ASSESSMENT/PLAN:    HTN - BP in clinic interestingly 122/56 whereas readings at home prior to BP meds SBP 180-190s. Reports readings are lower after medications and after physical activity as low as 120-130s.  Adjust Clonidine 0.2mg  BID to 12 hours apart to eliminate rebound hypertension. Unfortunately clonidine patch cost prohibitive.  Phone call in one week to check in on BP, if routinely >130/80, consider adding lower dose of Hydralazine  midday Of note, on Diltiazem  for esophageal spasm benefit not solely BP lowering benefit per prior documentation. Could consider transition transition to long acting agent in the future.  Secondary hypertension evaluation: OSA treated with CPAP 05/2024 mild bilateral renal artery stenosis. 05/2024 no evidence of hyperaldosteronism 05/2024 normal plasma metanephrines   Diastolic heart failure/lower extremity edema - Per primary cardiology team.   HLD, LDL goal than 70- 03/05/24 LDL 49. Continue Rosuvastatin 5mg  daily.   Renal artery stenosis-05/2024 renal duplex bilateral 1-59% stenosis. Continue lipid management, as above.   Carotid artery stenosis-CTA 40 to 50% ICA stenosis. Continue lipid management, as above.   COPD- Followed by primary care.   OSA- CPAP compliance encouraged. Wears regularly.   Chronic back pain - limits physical activity. 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 Per pharmacy dispense report, has picked up 14 tablets of hydrocodone-acetaminophen 5-325 on 01/25/24, 04/24/24, 06/13/24 as prescribed by his primary care provider and uses very sparingly. He is interested in alternative methods to control pain such as acupuncture or non-opioid medications. Refer to Physical Medicine and Rehab.   Screening for Secondary Hypertension:     Relevant Labs/Studies:    Latest Ref Rng & Units 06/10/2024    4:06 PM 04/24/2024   10:25 AM 12/05/2021    8:43 AM  Basic  Labs  Sodium 135 - 145 mmol/L 140  140  138   Potassium 3.5 - 5.1 mmol/L 3.7  3.4  3.0   Creatinine 0.61 - 1.24 mg/dL 8.68  8.87  8.85        Latest Ref Rng & Units 02/22/2023    2:19 PM  Thyroid    TSH 0.40 - 4.50 mIU/L 5.72        Latest Ref Rng & Units 06/10/2024    4:06 PM  Renin/Aldosterone   Aldosterone 0.0 - 30.0 ng/dL 2.1        Latest Ref Rng & Units 06/10/2024    4:06 PM  Metanephrines/Catecholamines   Metanephrines 0.0 - 88.0 pg/mL 29.3   Normetanephrines  0.0 - 285.2 pg/mL 91.1           06/05/2024    8:55 AM  Renovascular   Renal Artery US  Completed Yes      Disposition:    FU with MD/APP/PharmD in 2 months    Medication Adjustments/Labs and Tests Ordered: Current medicines are reviewed at length with the patient today.  Concerns regarding medicines are outlined above.  No orders of the defined types were placed in this encounter.  No orders of the defined types were placed in this encounter.    Signed, Reche GORMAN Finder, NP  07/04/2024 2:45 PM    [facility]

## 2024-08-05 ENCOUNTER — Telehealth: Payer: Self-pay | Admitting: Cardiology

## 2024-08-05 DIAGNOSIS — Z79899 Other long term (current) drug therapy: Secondary | ICD-10-CM

## 2024-08-05 NOTE — Telephone Encounter (Signed)
 Wife is calling stating that she is following up on if Dr Alvan was going to do labs on Christian Carrillo. She states they spoke about this yesterday when she was in the office.

## 2024-08-11 NOTE — Telephone Encounter (Signed)
 Can he get a cmet, mg, cbc please  JINNY Ross MD

## 2024-08-11 NOTE — Telephone Encounter (Signed)
 No answer, voicemail not set up.orders placed for [facility].

## 2024-08-25 ENCOUNTER — Encounter: Payer: Self-pay | Admitting: *Deleted

## 2024-08-25 NOTE — Telephone Encounter (Signed)
 Attempted to reach again this morning without success.  Will put lab orders in the mail.

## 2024-09-05 NOTE — Telephone Encounter (Signed)
 Patient is returning phone call. Patient requested we call him at [phone].

## 2024-09-05 NOTE — Telephone Encounter (Signed)
 Christian Carrillo says call him on home number [phone]   His cell is his wifes number and it does not have voicemail.   He will get his lab work done at [facility] and he confirmed his apt on 10/23 with HTN clinic.

## 2024-09-10 ENCOUNTER — Encounter (INDEPENDENT_AMBULATORY_CARE_PROVIDER_SITE_OTHER): Payer: Self-pay | Admitting: Gastroenterology

## 2024-09-15 ENCOUNTER — Encounter (HOSPITAL_BASED_OUTPATIENT_CLINIC_OR_DEPARTMENT_OTHER): Payer: Self-pay

## 2024-09-18 ENCOUNTER — Ambulatory Visit (HOSPITAL_BASED_OUTPATIENT_CLINIC_OR_DEPARTMENT_OTHER): Admitting: Family

## 2024-09-18 ENCOUNTER — Encounter (HOSPITAL_BASED_OUTPATIENT_CLINIC_OR_DEPARTMENT_OTHER): Payer: Self-pay | Admitting: Family

## 2024-09-18 VITALS — BP 128/72 | HR 58 | Ht 72.0 in | Wt 239.0 lb

## 2024-09-18 DIAGNOSIS — I5032 Chronic diastolic (congestive) heart failure: Secondary | ICD-10-CM

## 2024-09-18 DIAGNOSIS — I1A0 Resistant hypertension: Secondary | ICD-10-CM

## 2024-09-18 DIAGNOSIS — G4733 Obstructive sleep apnea (adult) (pediatric): Secondary | ICD-10-CM

## 2024-09-18 NOTE — Progress Notes (Signed)
 [facility] Assessment:    Date:  09/18/2024   ID:  Christian Carrillo, DOB 04-10-1951, MRN [id number]  PCP:  Maree Isles, MD  Cardiologist:  Alvan Carrier, MD  Nephrologist:  Referring MD: Maree Isles, MD   CC: Hypertension  History of Present Illness:    Christian Carrillo is a 73 y.o. male with a hx of chronic diastolic heart failure, resistant hypertension, esophageal spasm, renal artery stenosis, OSA, COPD, hepatitis C, carotid stenosis here to follow up in the [facility].   On 06/17/24 Hydralazine  increased to 100mg  BID by Dr. Alvan.   Established with [facility] 07/04/24. Christian Carrillo was diagnosed with hypertension years ago after coming off of opioids which were prescribed for back pain which he self discontinued. It has been difficult to control. Blood pressure checked with arm cuff at home which the readings went to his primary care doctor. Readings were labile. No prior tobacco use. Exercise limited by back pain. He was eating at home and out of home and low sodium diet advised.   At initial visit, Clonidine adjusted to 0.2mg  BID to eliminate rebound hypertension. Clonidine patch cost prohibitive. Referred to physical medicine and rehab for pain control, however did not answer phone call regarding referral and was not scheduled.   Presents today for follow up with his wife. Reports feeling wore out sleeping well. Reports right sided chest tightness that occurs at rest or with activity. It independently resolves after a couple minutes and ocurs a couple times per week. Feels he still has some fluid on his chest. Reports bilateral lower extremity edema. Reports BP at home has been elevated particularly in the morning prior to his medications. Takes medications at 8am and 10 pm. Notes BP was better after our recent visit, then increased over the last few weeks. His wife attributes this to fluid retention. No changes in dietary  habits.   Previous antihypertensives: Spironolactone  - AKI Hydrochlorothiazide Chlorthalidone  - changed to Lasix  Losartan  - changed to olmesartan  Amlodipine - deferred due to use of Diltiazem  metoprolol   Past Medical History:  Diagnosis Date   Anxiety    Arthritis    Chronic back pain    COPD (chronic obstructive pulmonary disease) (HCC)    Depression    GERD (gastroesophageal reflux disease)    Hepatitis    Hypertension    Localized edema    Other emphysema (HCC)    Shortness of breath dyspnea     Past Surgical History:  Procedure Laterality Date   BACK SURGERY     BIOPSY  02/18/2020   Procedure: BIOPSY;  Surgeon: Golda Claudis PENNER, MD;  [facility];  Service: Endoscopy;;  gastric   BIOPSY  04/13/2023   Procedure: BIOPSY;  Surgeon: Eartha Angelia Sieving, MD;  [facility];  Service: Gastroenterology;;   cataract surgery     x 1 eye   COLONOSCOPY WITH PROPOFOL  N/A 12/07/2021   Procedure: COLONOSCOPY WITH PROPOFOL ;  Surgeon: Eartha Angelia Sieving, MD;  [facility];  Service: Gastroenterology;  Laterality: N/A;  9:45   COLONOSCOPY WITH PROPOFOL  N/A 04/13/2023   Procedure: COLONOSCOPY WITH PROPOFOL ;  Surgeon: Eartha Angelia Sieving, MD;  [facility];  Service: Gastroenterology;  Laterality: N/A;  9:45am;ASA 2   ESOPHAGEAL DILATION N/A 02/18/2020   Procedure: ESOPHAGEAL DILATION;  Surgeon: Golda Claudis PENNER, MD;  [facility];  Service: Endoscopy;  Laterality: N/A;   ESOPHAGOGASTRODUODENOSCOPY N/A 05/03/2016  Procedure: ESOPHAGOGASTRODUODENOSCOPY (EGD);  Surgeon: Claudis RAYMOND Rivet, MD;  [facility];  Service: Endoscopy;  Laterality: N/A;  1:00 - moved to 6/7 @ 7:30 - Ann notified pt   ESOPHAGOGASTRODUODENOSCOPY N/A 02/18/2020   Procedure: ESOPHAGOGASTRODUODENOSCOPY (EGD);  Surgeon: Rivet Claudis RAYMOND, MD;  [facility];  Service: Endoscopy;  Laterality: N/A;  240   ESOPHAGOGASTRODUODENOSCOPY (EGD)  WITH PROPOFOL  N/A 04/13/2023   Procedure: ESOPHAGOGASTRODUODENOSCOPY (EGD) WITH PROPOFOL ;  Surgeon: Eartha Angelia Sieving, MD;  [facility];  Service: Gastroenterology;  Laterality: N/A;   POLYPECTOMY  12/07/2021   Procedure: POLYPECTOMY;  Surgeon: Eartha Angelia Sieving, MD;  [facility];  Service: Gastroenterology;;   POLYPECTOMY  04/13/2023   Procedure: POLYPECTOMY;  Surgeon: Eartha Angelia, Sieving, MD;  [facility];  Service: Gastroenterology;;   rt femur fx     traction   rt shoulder surgery pinning.       Current Medications: Current Meds  Medication Sig   acetaminophen (TYLENOL) 500 MG tablet Take 1,000 mg by mouth every 6 (six) hours as needed for mild pain or moderate pain.   albuterol (VENTOLIN HFA) 108 (90 Base) MCG/ACT inhaler Inhale 1 puff into the lungs every 6 (six) hours as needed for wheezing or shortness of breath.   amitriptyline (ELAVIL) 10 MG tablet Take 1 tablet by mouth at bedtime.   aspirin  EC 81 MG tablet Take 81 mg by mouth daily. Swallow whole.   cloNIDine (CATAPRES) 0.2 MG tablet Take 0.2 mg by mouth 2 (two) times daily.   diclofenac (VOLTAREN) 75 MG EC tablet Take 75 mg by mouth 2 (two) times daily.   diltiazem  (CARDIZEM ) 90 MG tablet Take 90 mg by mouth 2 (two) times daily.   FLUoxetine (PROZAC) 20 MG capsule Take 20 mg by mouth daily.    fluticasone (FLONASE) 50 MCG/ACT nasal spray 2 sprays.   furosemide  (LASIX ) 20 MG tablet Take 40 mg ( 2 Tablets) in the AM and Take 20 mg ( 1 Tablet ) in the PM   hydrALAZINE  (APRESOLINE ) 100 MG tablet Take 1 tablet (100 mg total) by mouth 2 (two) times daily.   magnesium  oxide (MAG-OX) 400 (240 Mg) MG tablet Take 400 mg by mouth 2 (two) times daily.   Magnesium  Oxide, Elemental, 400 MG TABS Take 400 mg by mouth 2 (two) times daily.   olmesartan  (BENICAR ) 40 MG tablet Take 1 tablet (40 mg total) by mouth daily.   omeprazole  (PRILOSEC) 40 MG capsule Take 40 mg by mouth daily.  (Patient taking differently: Take 40 mg by mouth in the morning and at bedtime.)   potassium chloride  SA (KLOR-CON  M20) 20 MEQ tablet Take 2 tablets (40 mEq total) by mouth daily.   rosuvastatin (CRESTOR) 5 MG tablet Take 5 mg by mouth daily.   tamsulosin (FLOMAX) 0.4 MG CAPS capsule Take 0.4 mg by mouth at bedtime.   traZODone (DESYREL) 100 MG tablet Take 100 mg by mouth at bedtime.     Allergies:   Codeine   Social History   Socioeconomic History   Marital status: Married    Spouse name: Not on file   Number of children: Not on file   Years of education: Not on file   Highest education level: Not on file  Occupational History   Not on file  Tobacco Use   Smoking status: Every Day    Current packs/day: 0.00    Average packs/day: 1 pack/day for 30.0 years (30.0 ttl pk-yrs)  Types: Cigarettes    Start date: 03/06/1986    Last attempt to quit: 03/06/2016    Years since quitting: 8.5    Passive exposure: Current   Smokeless tobacco: Never  Vaping Use   Vaping status: Never Used  Substance and Sexual Activity   Alcohol  use: No    Alcohol /week: 0.0 standard drinks of alcohol    Drug use: No   Sexual activity: Not on file  Other Topics Concern   Not on file  Social History Narrative   Not on file   Social Drivers of Health   Financial Resource Strain: Low Risk  (05/03/2024)   Received from [facility]   Overall Financial Resource Strain (CARDIA)    Difficulty of Paying Living Expenses: Not hard at all  Food Insecurity: No Food Insecurity (05/03/2024)   Received from [facility]   Hunger Vital Sign    Within the past 12 months, you worried that your food would run out before you got the money to buy more.: Never true    Within the past 12 months, the food you bought just didn't last and you didn't have money to get more.: Never true  Transportation Needs: No Transportation Needs (05/03/2024)   Received from [facility]   PRAPARE - Transportation    Lack of  Transportation (Medical): No    Lack of Transportation (Non-Medical): No  Physical Activity: Inactive (05/02/2024)   Received from [facility]   Exercise Vital Sign    On average, how many days per week do you engage in moderate to strenuous exercise (like a brisk walk)?: 0 days    On average, how many minutes do you engage in exercise at this level?: 0 min  Stress: No Stress Concern Present (05/02/2024)   Received from [facility] - Occupational Stress Questionnaire    Feeling of Stress : Not at all  Social Connections: Socially Integrated (05/02/2024)   Received from [facility]   Social Connection and Isolation Panel    In a typical week, how many times do you talk on the phone with family, friends, or neighbors?: Three times a week    How often do you get together with friends or relatives?: Twice a week    How often do you attend church or religious services?: More than 4 times per year    Do you belong to any clubs or organizations such as church groups, unions, fraternal or athletic groups, or school groups?: Yes    How often do you attend meetings of the clubs or organizations you belong to?: More than 4 times per year    Are you married, widowed, divorced, separated, never married, or living with a partner?: Married     Family History: The patient's family history includes Cancer in his father; Diabetes Mellitus II in his brother and mother; Heart attack in his brother; Hypothyroidism in his mother; Obesity in his mother.  ROS:   Please see the history of present illness.     All other systems reviewed and are negative.  EKGs/Labs/Other Studies Reviewed:         Recent Labs: 06/10/2024: BUN 14; Creatinine, Ser 1.31; Magnesium  1.6; Potassium 3.7; Sodium 140   Recent Lipid Panel    Component Value Date/Time   CHOL 221 (H) 05/22/2017 1003   TRIG 187 (H) 05/22/2017 1003   HDL 28 (L) 05/22/2017 1003   CHOLHDL 7.9 (H)  05/22/2017 1003  VLDL 37 (H) 05/22/2017 1003   LDLCALC 156 (H) 05/22/2017 1003    Physical Exam:   VS:  BP 122/60 (BP Location: Right Arm, Patient Position: Sitting, Cuff Size: Normal)   Pulse (!) 58   Ht 6' (1.829 m)   Wt 239 lb (108.4 kg)   SpO2 94%   BMI 32.41 kg/m  , BMI Body mass index is 32.41 kg/m. GENERAL:  Well appearing HEENT: Pupils equal round and reactive, fundi not visualized, oral mucosa unremarkable NECK:  No jugular venous distention, waveform within normal limits, carotid upstroke brisk and symmetric, no bruits, no thyromegaly LYMPHATICS:  No cervical adenopathy LUNGS:  Clear to auscultation bilaterally HEART:  RRR.  PMI not displaced or sustained,S1 and S2 within normal limits, no S3, no S4, no clicks, no rubs, no murmurs ABD:  Flat, positive bowel sounds normal in frequency in pitch, no bruits, no rebound, no guarding, no midline pulsatile mass, no hepatomegaly, no splenomegaly EXT:  2 plus pulses throughout, no edema, no cyanosis no clubbing SKIN:  No rashes no nodules NEURO:  Cranial nerves II through XII grossly intact, motor grossly intact throughout PSYCH:  Cognitively intact, oriented to person place and time   ASSESSMENT/PLAN:    HTN - BP in clinic interestingly 122/60 whereas readings at home prior to BP meds SBP 180-190s. Assures me home BP cuff provided by PCP has been calibrated. Continue Clonidine 0.2mg  BID to 12 hours apart to eliminate rebound hypertension. Unfortunately clonidine patch cost prohibitive.  Of note, on Diltiazem  for esophageal spasm benefit not solely BP lowering benefit per prior documentation. Could consider transition transition to long acting agent in the future. Message routed to prescribing provider.  Secondary hypertension evaluation: OSA treated with CPAP 05/2024 mild bilateral renal artery stenosis. 05/2024 no evidence of hyperaldosteronism 05/2024 normal plasma metanephrines  Consider renal denervation, conversation  initiated today  Diastolic heart failure/lower extremity edema - notes sensation of fluid retention, leg swelling, elevated BP since perceived fluid retention. Increase Lasix  to 60mg  and 20mg  pm x 3 days then return to 40mg  am and 20mg  pm. Labs in one week bmet, bnp.   HLD, LDL goal than 70- 03/05/24 LDL 49. Continue Rosuvastatin 5mg  daily.   Renal artery stenosis-05/2024 renal duplex bilateral 1-59% stenosis. Continue lipid management, as above.   Carotid artery stenosis-CTA 40 to 50% ICA stenosis. Continue lipid management, as above.   COPD- Followed by primary care.   OSA- CPAP compliance encouraged. Wears regularly.   Chronic back pain - limits physical activity. 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   Screening for Secondary Hypertension:     Relevant Labs/Studies:    Latest Ref Rng & Units 06/10/2024    4:06 PM 04/24/2024   10:25 AM 12/05/2021    8:43 AM  Basic Labs  Sodium 135 - 145 mmol/L 140  140  138   Potassium 3.5 - 5.1 mmol/L 3.7  3.4  3.0   Creatinine 0.61 - 1.24 mg/dL 8.68  8.87  8.85        Latest Ref Rng & Units 02/22/2023    2:19 PM  Thyroid    TSH 0.40 - 4.50 mIU/L 5.72        Latest Ref Rng & Units 06/10/2024    4:06 PM  Renin/Aldosterone   Aldosterone 0.0 - 30.0 ng/dL 2.1        Latest Ref Rng & Units 06/10/2024    4:06 PM  Metanephrines/Catecholamines   Metanephrines  0.0 - 88.0 pg/mL 29.3   Normetanephrines  0.0 - 285.2 pg/mL 91.1           06/05/2024    8:55 AM  Renovascular   Renal Artery US  Completed Yes      Disposition:    FU with MD/APP/PharmD in 2-3 months and with Dr. Alvan in 5 months   Medication Adjustments/Labs and Tests Ordered: Current medicines are reviewed at length with the patient today.  Concerns regarding medicines are outlined above.  No orders of the defined types were placed in this encounter.  No orders of the defined types were placed in this  encounter.    Signed, Reche GORMAN Finder, NP  09/18/2024 8:38 AM    [facility]

## 2024-09-18 NOTE — Patient Instructions (Addendum)
 Medication Instructions:   CHANGE Furosemide  to 3 tablets (60mg ) in the morning and 1 tablet (20mg ) in the afternoon for 3 days  After 3 days, return to 2 tablet (40mg ) in the morning and 1 tablet (20mg ) in the afternoon   Reche GORMAN Finder, NP will send a note to Dr. Luba about changing your Diltiazem  to an extended release tablet   Labwork: Your physician recommends that you return for lab work in one week: BMET, BNP   Follow-Up: Please follow up in 3 months in [facility] with Dr. Raford, Reche Finder, NP or Allean Mink PharmD  follow up in 5 months with Dr. Alvan   Please bring your blood pressure cuff to your next office visit.

## 2024-09-26 ENCOUNTER — Other Ambulatory Visit (HOSPITAL_COMMUNITY)
Admission: RE | Admit: 2024-09-26 | Discharge: 2024-09-26 | Disposition: A | Discharge status: Home / Self Care | Source: Ambulatory Visit | Attending: Family | Admitting: Family

## 2024-09-26 ENCOUNTER — Ambulatory Visit (HOSPITAL_BASED_OUTPATIENT_CLINIC_OR_DEPARTMENT_OTHER): Payer: Self-pay | Admitting: Family

## 2024-09-26 DIAGNOSIS — I5032 Chronic diastolic (congestive) heart failure: Secondary | ICD-10-CM | POA: Insufficient documentation

## 2024-09-26 LAB — BASIC METABOLIC PANEL WITH GFR
Anion gap: 10 (ref 5–15)
BUN: 16 mg/dL (ref 8–23)
CO2: 27 mmol/L (ref 22–32)
Calcium: 9 mg/dL (ref 8.9–10.3)
Chloride: 103 mmol/L (ref 98–111)
Creatinine, Ser: 1.43 mg/dL — ABNORMAL HIGH (ref 0.61–1.24)
GFR, Estimated: 53 mL/min — ABNORMAL LOW (ref >60)
Glucose, Bld: 125 mg/dL — ABNORMAL HIGH (ref 70–99)
Potassium: 4.7 mmol/L (ref 3.5–5.1)
Sodium: 140 mmol/L (ref 135–145)

## 2024-09-26 LAB — PRO BRAIN NATRIURETIC PEPTIDE: Pro Brain Natriuretic Peptide: 171 pg/mL (ref <300.0)

## 2024-09-29 ENCOUNTER — Telehealth (HOSPITAL_BASED_OUTPATIENT_CLINIC_OR_DEPARTMENT_OTHER): Payer: Self-pay | Admitting: Family

## 2024-09-29 MED ORDER — DILTIAZEM HCL ER COATED BEADS 180 MG PO CP24: 180.0000 mg | ORAL_CAPSULE | Freq: Every day | ORAL | 3 refills | Status: AC

## 2024-09-29 NOTE — Telephone Encounter (Signed)
 Agree with plan - TY!  Alver Sorrow, NP

## 2024-09-29 NOTE — Telephone Encounter (Signed)
 Advised wife, ok per patient  Per wife still has lots of swelling so will increase Lasix  for 3 days and call back end of week with update Blood pressure this am 190/95, sent new rx for Diltiazem  ER 180 mg daily & d/c 90 mg twice a day   Patient does have chest cold/congestion and Dr Loreli put on antibiotics Thursday

## 2024-09-29 NOTE — Telephone Encounter (Signed)
 Reche GORMAN Finder, NP 09/26/2024  1:30 PM EDT     Creatinine elevated from previous, likely due to recently increased fluid pill. Ensure staying well hydrated. ProBNP with no significant volume overload.    Please inquire if LE edema has improved since increased dose of Lasix  for a few days last week. Please inquire how BP has been.    If still with significant edema: adjust Lasix  to 40mg  twice daily x 3 days then provide us  an update Monday.   If BP not at goal <130/80, stop Diltiazem  90mg  BID and start Diltiazem  ER (extended release) 180mg  daily. This will help it to slowly release throughout the day to keep better control of BP.    Spoke with patient regarding labs   Swelling improved but if stands on his feet long time returns Blood pressure not consistently below 130/80 Unsure of the Diltiazem  dose, will call him back in couple of hours to confirm dose

## 2024-09-29 NOTE — Telephone Encounter (Signed)
-----   Message from Reche GORMAN Finder sent at 09/26/2024  1:30 PM EDT ----- Creatinine elevated from previous, likely due to recently increased fluid pill. Ensure staying well hydrated. ProBNP with no significant volume overload.   Please inquire if LE edema has improved since increased dose of Lasix  for a few days last week. Please inquire how BP has been.   If still with significant edema: adjust Lasix  to 40mg  twice daily x 3 days then provide us  an update Monday.  If BP not at goal <130/80, stop Diltiazem  90mg  BID and start Diltiazem  ER (extended release) 180mg  daily. This will help it to slowly release throughout the day to keep better control of BP.  ----- Message ----- From: Interface, Lab In [place] Sent: 09/26/2024  11:05 AM EDT To: Reche GORMAN Finder, NP

## 2024-09-29 NOTE — Telephone Encounter (Signed)
  Patient is returning call regarding results 

## 2024-11-26 ENCOUNTER — Telehealth: Payer: Self-pay | Admitting: Cardiology

## 2024-11-26 NOTE — Telephone Encounter (Signed)
 Spoke with the pt.   He states his BP readings have been elevated in the AM and PM everyday.  He states his middle of the day readings are normal.    Pt states he checks his BP's 2-3 times a day.  He takes his BP before he takes his morning medications, around 3 pm, and before taking his evening medications.    Pt states his BP's in the morning before meds is on average running in the 190s systolic.  He states his middle of the day  readings are on average in the 130s systolic 70s diastolic.   He states in the evenings before meds, his readings are consistently running in the 190s systolic.   Pt states he doesn't have his exact BP recordings on him at this time.  He states he is away from his home and will not be able to get to those readings until sometime tomorrow.    Pt states he takes the following BP meds in the AM:  olmesartan  40 mg, hydralazine  100 mg, lasix  40 mg, diltiazem  180 mg, clonidine 0.2 mg.    He states he takes the following BP meds in the PM:  hydralazine  100 mg, lasix  20 mg, and clonidine 0.2 mg.  Pt is inquiring from Reche Finder, NP if he needs to be seen sooner or should she increase the dose or add a medication to his BP regimen.  Advised the pt to start taking and logging his BP's in the morning and evening time, 1 hour after med administration and after resting for about 5-10 mins.    Advised the pt to maintain a low sodium diet.   Pt is aware I will route this message to Reche Finder, NP for further review and advisement.  He is aware we will follow-up with him accordingly thereafter.   Pt verbalized understanding and agrees with this plan.

## 2024-11-26 NOTE — Telephone Encounter (Signed)
 Pt c/o BP issue: STAT if pt c/o blurred vision, one-sided weakness or slurred speech.  STAT if BP is GREATER than 180/120 TODAY.  STAT if BP is LESS than 90/60 and SYMPTOMATIC TODAY  1. What is your BP concern?   Wife (Tammie) stated patient is concerned that his BP readings have been trending high  2. Have you taken any BP medication today?  Yes  3. What are your last 5 BP readings?  Not available at this time.  Wife stated 190-something/100-something  4. Are you having any other symptoms (ex. Dizziness, headache, blurred vision, passed out)?  No  Wife stated NP Caitlyn put patient on a new medication but the medication is not working and patient's BP has been trending high.

## 2024-11-26 NOTE — Telephone Encounter (Signed)
 Spoke with the pt and wife and endorsed recommendations per Reche Finder, NP.   Reiterated to both parties that the pt needs to check his BP 1 hour after medication administration and write this down, and bring to his next appt with Reche Finder, NP in HTN clinic.   Offered 1/12 to the pt but his wife has another appt at that time.   Scheduled the pt to come into the office for HTN follow-up on 12/05/24 at 1005.  Pt and wife aware that I will cancel his 1/22 appt with Caitlin, being we will be seeing him next Friday.   Both parties verbalized understanding and agrees with this plan.

## 2024-11-26 NOTE — Telephone Encounter (Signed)
 Agree with plan to check BP one hour after meds and keep a log.  If he would like to come in sooner we can offer 12/08/24 at 10:05 am with me. Otherwise okay to keep appt as scheduled.   Fuquan Wilson S Treydon Henricks, NP

## 2024-12-05 ENCOUNTER — Ambulatory Visit (HOSPITAL_BASED_OUTPATIENT_CLINIC_OR_DEPARTMENT_OTHER): Admitting: Family

## 2024-12-05 ENCOUNTER — Encounter (HOSPITAL_BASED_OUTPATIENT_CLINIC_OR_DEPARTMENT_OTHER): Payer: Self-pay | Admitting: Family

## 2024-12-05 VITALS — BP 122/58 | HR 66 | Ht 72.0 in | Wt 232.0 lb

## 2024-12-05 DIAGNOSIS — R030 Elevated blood-pressure reading, without diagnosis of hypertension: Secondary | ICD-10-CM

## 2024-12-05 DIAGNOSIS — I1A0 Resistant hypertension: Secondary | ICD-10-CM

## 2024-12-05 DIAGNOSIS — I959 Hypotension, unspecified: Secondary | ICD-10-CM

## 2024-12-05 NOTE — Progress Notes (Unsigned)
 "  [facility] Assessment:    Date:  12/05/2024   ID:  Christian Carrillo, DOB 09/04/1951, MRN [id number]  PCP:  Maree Isles, MD  Cardiologist:  Alvan Carrier, MD  Nephrologist:  Referring MD: Maree Isles, MD   CC: Hypertension  History of Present Illness:    Christian Carrillo is a 74 y.o. male with a hx of chronic diastolic heart failure, resistant hypertension, esophageal spasm, renal artery stenosis, OSA, COPD, hepatitis C, carotid stenosis here to follow up in the [facility].   On 06/17/24 Hydralazine  increased to 100mg  BID by Dr. Alvan.   Established with [facility] 07/04/24. Christian Carrillo was diagnosed with hypertension years ago after coming off of opioids which were prescribed for back pain which he self discontinued. It has been difficult to control. Blood pressure checked with arm cuff at home which the readings went to his primary care doctor. Readings were labile. No prior tobacco use. Exercise limited by back pain. He was eating at home and out of home and low sodium diet advised.   At initial visit, Clonidine adjusted to 0.2mg  BID to eliminate rebound hypertension. Clonidine patch cost prohibitive. Referred to physical medicine and rehab for pain control, however did not answer phone call regarding referral and was not scheduled.   Presents today for follow up with his wife. Reports feeling wore out sleeping well. Reports right sided chest tightness that occurs at rest or with activity. It independently resolves after a couple minutes and ocurs a couple times per week. Feels he still has some fluid on his chest. Reports bilateral lower extremity edema. Reports BP at home has been elevated particularly in the morning prior to his medications. Takes medications at 8am and 10 pm. Notes BP was better after our recent visit, then increased over the last few weeks. His wife attributes this to fluid retention. No changes in dietary  habits.   Weight down 7 lbs ?prilosec BID  8am - 10pm  Previous antihypertensives: Spironolactone  - AKI Hydrochlorothiazide Chlorthalidone  - changed to Lasix  Losartan  - changed to olmesartan  Amlodipine - deferred due to use of Diltiazem  metoprolol   Past Medical History:  Diagnosis Date   Anxiety    Arthritis    Chronic back pain    COPD (chronic obstructive pulmonary disease) (HCC)    Depression    GERD (gastroesophageal reflux disease)    Hepatitis    Hypertension    Localized edema    Other emphysema (HCC)    Shortness of breath dyspnea     Past Surgical History:  Procedure Laterality Date   BACK SURGERY     BIOPSY  02/18/2020   Procedure: BIOPSY;  Surgeon: Golda Claudis PENNER, MD;  [facility];  Service: Endoscopy;;  gastric   BIOPSY  04/13/2023   Procedure: BIOPSY;  Surgeon: Eartha Angelia Sieving, MD;  [facility];  Service: Gastroenterology;;   cataract surgery     x 1 eye   COLONOSCOPY WITH PROPOFOL  N/A 12/07/2021   Procedure: COLONOSCOPY WITH PROPOFOL ;  Surgeon: Eartha Angelia Sieving, MD;  [facility];  Service: Gastroenterology;  Laterality: N/A;  9:45   COLONOSCOPY WITH PROPOFOL  N/A 04/13/2023   Procedure: COLONOSCOPY WITH PROPOFOL ;  Surgeon: Eartha Angelia Sieving, MD;  [facility];  Service: Gastroenterology;  Laterality: N/A;  9:45am;ASA 2   ESOPHAGEAL DILATION N/A 02/18/2020   Procedure: ESOPHAGEAL DILATION;  Surgeon: Golda Claudis PENNER, MD;  [facility];  Service: Endoscopy;  Laterality: N/A;   ESOPHAGOGASTRODUODENOSCOPY N/A 05/03/2016   Procedure: ESOPHAGOGASTRODUODENOSCOPY (EGD);  Surgeon: Claudis RAYMOND Rivet, MD;  [facility];  Service: Endoscopy;  Laterality: N/A;  1:00 - moved to 6/7 @ 7:30 - Ann notified pt   ESOPHAGOGASTRODUODENOSCOPY N/A 02/18/2020   Procedure: ESOPHAGOGASTRODUODENOSCOPY (EGD);  Surgeon: Rivet Claudis RAYMOND, MD;  [facility];  Service: Endoscopy;   Laterality: N/A;  240   ESOPHAGOGASTRODUODENOSCOPY (EGD) WITH PROPOFOL  N/A 04/13/2023   Procedure: ESOPHAGOGASTRODUODENOSCOPY (EGD) WITH PROPOFOL ;  Surgeon: Eartha Angelia Sieving, MD;  [facility];  Service: Gastroenterology;  Laterality: N/A;   POLYPECTOMY  12/07/2021   Procedure: POLYPECTOMY;  Surgeon: Eartha Angelia Sieving, MD;  [facility];  Service: Gastroenterology;;   POLYPECTOMY  04/13/2023   Procedure: POLYPECTOMY;  Surgeon: Eartha Angelia, Sieving, MD;  [facility];  Service: Gastroenterology;;   rt femur fx     traction   rt shoulder surgery pinning.       Current Medications: Current Meds  Medication Sig   acetaminophen (TYLENOL) 500 MG tablet Take 1,000 mg by mouth every 6 (six) hours as needed for mild pain or moderate pain.   albuterol (VENTOLIN HFA) 108 (90 Base) MCG/ACT inhaler Inhale 1 puff into the lungs every 6 (six) hours as needed for wheezing or shortness of breath.   amitriptyline (ELAVIL) 10 MG tablet Take 1 tablet by mouth at bedtime.   aspirin  EC 81 MG tablet Take 81 mg by mouth daily. Swallow whole.   cloNIDine (CATAPRES) 0.2 MG tablet Take 0.2 mg by mouth 2 (two) times daily.   diclofenac (VOLTAREN) 75 MG EC tablet Take 75 mg by mouth 2 (two) times daily.   diltiazem  (CARDIZEM  CD) 180 MG 24 hr capsule Take 1 capsule (180 mg total) by mouth daily.   FLUoxetine (PROZAC) 20 MG capsule Take 20 mg by mouth daily.    fluticasone (FLONASE) 50 MCG/ACT nasal spray 2 sprays. (Patient taking differently: Place 2 sprays into both nostrils daily as needed for allergies or rhinitis.)   furosemide  (LASIX ) 20 MG tablet Take 40 mg ( 2 Tablets) in the AM and Take 20 mg ( 1 Tablet ) in the PM   hydrALAZINE  (APRESOLINE ) 100 MG tablet Take 1 tablet (100 mg total) by mouth 2 (two) times daily.   Magnesium  Oxide, Elemental, 400 MG TABS Take 400 mg by mouth 2 (two) times daily.   olmesartan  (BENICAR ) 40 MG tablet Take 1 tablet (40 mg total)  by mouth daily.   omeprazole  (PRILOSEC) 40 MG capsule Take 40 mg by mouth daily. (Patient taking differently: Take 40 mg by mouth in the morning and at bedtime.)   potassium chloride  SA (KLOR-CON  M20) 20 MEQ tablet Take 2 tablets (40 mEq total) by mouth daily. (Patient taking differently: Take 20 mEq by mouth 2 (two) times daily.)   rosuvastatin (CRESTOR) 5 MG tablet Take 5 mg by mouth daily.   tamsulosin (FLOMAX) 0.4 MG CAPS capsule Take 0.4 mg by mouth at bedtime.   traZODone (DESYREL) 100 MG tablet Take 100 mg by mouth at bedtime.   [DISCONTINUED] magnesium  oxide (MAG-OX) 400 (240 Mg) MG tablet Take 400 mg by mouth 2 (two) times daily.     Allergies:   Codeine   Social History   Socioeconomic History   Marital status: Married    Spouse name: Not on file   Number of children: Not on file   Years of education: Not on file  Highest education level: Not on file  Occupational History   Not on file  Tobacco Use   Smoking status: Every Day    Current packs/day: 0.00    Average packs/day: 1 pack/day for 30.0 years (30.0 ttl pk-yrs)    Types: Cigarettes    Start date: 03/06/1986    Last attempt to quit: 03/06/2016    Years since quitting: 8.7    Passive exposure: Current   Smokeless tobacco: Never   Tobacco comments:    Smokes about 6-10 cigarettes daily now (12/05/24)  Vaping Use   Vaping status: Never Used  Substance and Sexual Activity   Alcohol  use: No    Alcohol /week: 0.0 standard drinks of alcohol    Drug use: No   Sexual activity: Not on file  Other Topics Concern   Not on file  Social History Narrative   Not on file   Social Drivers of Health   Tobacco Use: High Risk (12/05/2024)   Patient History    Smoking Tobacco Use: Every Day    Smokeless Tobacco Use: Never    Passive Exposure: Current  Financial Resource Strain: Low Risk (05/03/2024)   Received from [facility]   Overall Financial Resource Strain (CARDIA)    Difficulty of Paying Living Expenses: Not hard at  all  Food Insecurity: No Food Insecurity (05/03/2024)   Received from [facility]   Epic    Within the past 12 months, you worried that your food would run out before you got the money to buy more.: Never true    Within the past 12 months, the food you bought just didn't last and you didn't have money to get more.: Never true  Transportation Needs: No Transportation Needs (05/03/2024)   Received from [facility]   PRAPARE - Transportation    Lack of Transportation (Medical): No    Lack of Transportation (Non-Medical): No  Physical Activity: Inactive (05/02/2024)   Received from [facility]   Exercise Vital Sign    On average, how many days per week do you engage in moderate to strenuous exercise (like a brisk walk)?: 0 days    On average, how many minutes do you engage in exercise at this level?: 0 min  Stress: No Stress Concern Present (05/02/2024)   Received from [facility] - Occupational Stress Questionnaire    Feeling of Stress : Not at all  Social Connections: Socially Integrated (05/02/2024)   Received from [facility]   Social Connection and Isolation Panel    In a typical week, how many times do you talk on the phone with family, friends, or neighbors?: Three times a week    How often do you get together with friends or relatives?: Twice a week    How often do you attend church or religious services?: More than 4 times per year    Do you belong to any clubs or organizations such as church groups, unions, fraternal or athletic groups, or school groups?: Yes    How often do you attend meetings of the clubs or organizations you belong to?: More than 4 times per year    Are you married, widowed, divorced, separated, never married, or living with a partner?: Married  Depression (PHQ2-9): Not on file  Alcohol  Screen: Not on file  Housing: Not on file  Utilities: Low Risk (05/03/2024)   Received from [facility]   Utilities     Within  the past 12 months, have you been unable to get utilities(heat, electricity) when it was really needed?: No  Health Literacy: Low Risk (05/02/2024)   Received from [facility] Literacy    How often do you need to have someone help you when you read instructions, pamphlets, or other written material from your doctor or pharmacy?: Never     Family History: The patient's family history includes Cancer in his father; Diabetes Mellitus II in his brother and mother; Heart attack in his brother; Hypothyroidism in his mother; Obesity in his mother.  ROS:   Please see the history of present illness.     All other systems reviewed and are negative.  EKGs/Labs/Other Studies Reviewed:         Recent Labs: 06/10/2024: Magnesium  1.6 09/26/2024: BUN 16; Creatinine, Ser 1.43; Potassium 4.7; Pro Brain Natriuretic Peptide 171.0; Sodium 140   Recent Lipid Panel    Component Value Date/Time   CHOL 221 (H) 05/22/2017 1003   TRIG 187 (H) 05/22/2017 1003   HDL 28 (L) 05/22/2017 1003   CHOLHDL 7.9 (H) 05/22/2017 1003   VLDL 37 (H) 05/22/2017 1003   LDLCALC 156 (H) 05/22/2017 1003    Physical Exam:   VS:  BP (!) 122/58 (BP Location: Right Arm, Patient Position: Sitting, Cuff Size: Normal)   Pulse 66   Ht 6' (1.829 m)   Wt 232 lb (105.2 kg)   SpO2 94%   BMI 31.46 kg/m  , BMI Body mass index is 31.46 kg/m. GENERAL:  Well appearing HEENT: Pupils equal round and reactive, fundi not visualized, oral mucosa unremarkable NECK:  No jugular venous distention, waveform within normal limits, carotid upstroke brisk and symmetric, no bruits, no thyromegaly LYMPHATICS:  No cervical adenopathy LUNGS:  Clear to auscultation bilaterally HEART:  RRR.  PMI not displaced or sustained,S1 and S2 within normal limits, no S3, no S4, no clicks, no rubs, no murmurs ABD:  Flat, positive bowel sounds normal in frequency in pitch, no bruits, no rebound, no guarding, no midline pulsatile mass, no  hepatomegaly, no splenomegaly EXT:  2 plus pulses throughout, no edema, no cyanosis no clubbing SKIN:  No rashes no nodules NEURO:  Cranial nerves II through XII grossly intact, motor grossly intact throughout PSYCH:  Cognitively intact, oriented to person place and time   ASSESSMENT/PLAN:    HTN - BP in clinic interestingly 122/60 whereas readings at home prior to BP meds SBP 180-190s. Assures me home BP cuff provided by PCP has been calibrated. Continue Clonidine 0.2mg  BID to 12 hours apart to eliminate rebound hypertension. Unfortunately clonidine patch cost prohibitive.  Of note, on Diltiazem  for esophageal spasm benefit not solely BP lowering benefit per prior documentation. Could consider transition transition to long acting agent in the future. Message routed to prescribing provider.  Secondary hypertension evaluation: OSA treated with CPAP 05/2024 mild bilateral renal artery stenosis. 05/2024 no evidence of hyperaldosteronism 05/2024 normal plasma metanephrines  Consider renal denervation, conversation initiated today  Diastolic heart failure/lower extremity edema - notes sensation of fluid retention, leg swelling, elevated BP since perceived fluid retention. Increase Lasix  to 60mg  and 20mg  pm x 3 days then return to 40mg  am and 20mg  pm. Labs in one week bmet, bnp.   HLD, LDL goal than 70- 03/05/24 LDL 49. Continue Rosuvastatin 5mg  daily.   Renal artery stenosis-05/2024 renal duplex bilateral 1-59% stenosis. Continue lipid management, as above.   Carotid artery stenosis-CTA 40 to 50% ICA stenosis. Continue lipid management,  as above.   COPD- Followed by primary care.   OSA- CPAP compliance encouraged. Wears regularly.   Chronic back pain - limits physical activity. 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   Screening for Secondary Hypertension:     Relevant Labs/Studies:    Latest Ref Rng & Units 09/26/2024   10:41 AM 06/10/2024     4:06 PM 04/24/2024   10:25 AM  Basic Labs  Sodium 135 - 145 mmol/L 140  140  140   Potassium 3.5 - 5.1 mmol/L 4.7  3.7  3.4   Creatinine 0.61 - 1.24 mg/dL 8.56  8.68  8.87        Latest Ref Rng & Units 02/22/2023    2:19 PM  Thyroid    TSH 0.40 - 4.50 mIU/L 5.72        Latest Ref Rng & Units 06/10/2024    4:06 PM  Renin/Aldosterone   Aldosterone 0.0 - 30.0 ng/dL 2.1        Latest Ref Rng & Units 06/10/2024    4:06 PM  Metanephrines/Catecholamines   Metanephrines 0.0 - 88.0 pg/mL 29.3   Normetanephrines  0.0 - 285.2 pg/mL 91.1           06/05/2024    8:55 AM  Renovascular   Renal Artery US  Completed Yes      Disposition:    FU with MD/APP/PharmD in 2-3 months and with Dr. Alvan in 5 months   Medication Adjustments/Labs and Tests Ordered: Current medicines are reviewed at length with the patient today.  Concerns regarding medicines are outlined above.  No orders of the defined types were placed in this encounter.  No orders of the defined types were placed in this encounter.    Signed, Reche GORMAN Finder, NP  12/05/2024 10:11 AM    [facility] "

## 2024-12-05 NOTE — Patient Instructions (Addendum)
 Medication Instructions:   Please continue taking clonidine 0.2 mg by mouth twice daily--make sure you take these 12 hours apart     Testing/Procedures:  You will be going to our [facility] location to get your 24 hour blood pressure monitor---[facility] address is [address] [place], [place] [address]--bnl will be meeting our Monitor Tech Rico Dixons on the 5th floor [address]ONE A on 12/08/24 at 2 pm--arrive 15 mins prior to this appointment    Follow-Up:  With Dr. Alvan as scheduled  Please follow up in _3_ months in [facility] with Dr. Raford, Reche Finder, NP or Allean Mink PharmD    Special Instructions:   You will be going to our [place] location to get your 24 hour blood pressure monitor---[facility] address is [address] [place], [place] [address]--bnl will be meeting our Monitor Tech Rico Dixons on the 5th floor ZONE A on 12/08/24 at 2 pm--arrive 15 mins prior to this appointment

## 2024-12-07 ENCOUNTER — Encounter (HOSPITAL_BASED_OUTPATIENT_CLINIC_OR_DEPARTMENT_OTHER): Payer: Self-pay | Admitting: Family

## 2024-12-08 ENCOUNTER — Ambulatory Visit: Attending: Family

## 2024-12-08 DIAGNOSIS — I959 Hypotension, unspecified: Secondary | ICD-10-CM

## 2024-12-08 DIAGNOSIS — R030 Elevated blood-pressure reading, without diagnosis of hypertension: Secondary | ICD-10-CM

## 2024-12-08 NOTE — Progress Notes (Unsigned)
 24 hour ambulatory blood pressure monitor applied to patients left arm.   Dr. Dorn Ross to read.

## 2024-12-18 ENCOUNTER — Encounter (HOSPITAL_BASED_OUTPATIENT_CLINIC_OR_DEPARTMENT_OTHER): Admitting: Family

## 2025-02-23 ENCOUNTER — Ambulatory Visit: Admitting: Cardiology

## 2025-03-05 ENCOUNTER — Encounter (HOSPITAL_BASED_OUTPATIENT_CLINIC_OR_DEPARTMENT_OTHER): Admitting: Family
